# Patient Record
Sex: Female | Born: 1950
Health system: Southern US, Community
[De-identification: ages and names within clinical notes are randomized; demographics above are authoritative.]

## PROBLEM LIST (undated history)

## (undated) HISTORY — PX: APPENDECTOMY: SHX54

## (undated) HISTORY — PX: TUBAL LIGATION: SHX77

---

## 2000-08-02 ENCOUNTER — Other Ambulatory Visit: Admission: RE | Admit: 2000-08-02 | Discharge: 2000-08-02 | Payer: Self-pay | Admitting: Family Medicine

## 2015-07-23 DIAGNOSIS — E6609 Other obesity due to excess calories: Secondary | ICD-10-CM | POA: Insufficient documentation

## 2015-07-23 DIAGNOSIS — M150 Primary generalized (osteo)arthritis: Secondary | ICD-10-CM

## 2015-07-23 DIAGNOSIS — M159 Polyosteoarthritis, unspecified: Secondary | ICD-10-CM | POA: Insufficient documentation

## 2015-07-23 DIAGNOSIS — M8949 Other hypertrophic osteoarthropathy, multiple sites: Secondary | ICD-10-CM

## 2015-07-23 DIAGNOSIS — Z6830 Body mass index (BMI) 30.0-30.9, adult: Secondary | ICD-10-CM | POA: Insufficient documentation

## 2015-07-23 HISTORY — DX: Primary generalized (osteo)arthritis: M15.0

## 2015-07-23 HISTORY — DX: Polyosteoarthritis, unspecified: M15.9

## 2015-07-23 HISTORY — DX: Other hypertrophic osteoarthropathy, multiple sites: M89.49

## 2015-07-30 DIAGNOSIS — E782 Mixed hyperlipidemia: Secondary | ICD-10-CM

## 2015-07-30 DIAGNOSIS — M8580 Other specified disorders of bone density and structure, unspecified site: Secondary | ICD-10-CM | POA: Insufficient documentation

## 2015-07-30 HISTORY — DX: Mixed hyperlipidemia: E78.2

## 2017-09-21 DIAGNOSIS — F330 Major depressive disorder, recurrent, mild: Secondary | ICD-10-CM | POA: Insufficient documentation

## 2017-09-21 DIAGNOSIS — M519 Unspecified thoracic, thoracolumbar and lumbosacral intervertebral disc disorder: Secondary | ICD-10-CM

## 2017-09-21 DIAGNOSIS — Z780 Asymptomatic menopausal state: Secondary | ICD-10-CM | POA: Insufficient documentation

## 2017-09-21 HISTORY — DX: Major depressive disorder, recurrent, mild: F33.0

## 2017-09-21 HISTORY — DX: Unspecified thoracic, thoracolumbar and lumbosacral intervertebral disc disorder: M51.9

## 2018-02-02 HISTORY — PX: CHOLECYSTECTOMY: SHX55

## 2018-11-06 DIAGNOSIS — D75839 Thrombocytosis, unspecified: Secondary | ICD-10-CM | POA: Insufficient documentation

## 2018-11-17 LAB — HM COLONOSCOPY

## 2019-11-23 ENCOUNTER — Other Ambulatory Visit: Payer: Self-pay

## 2019-11-23 ENCOUNTER — Encounter: Payer: Self-pay | Admitting: Legal Medicine

## 2019-11-23 ENCOUNTER — Ambulatory Visit (INDEPENDENT_AMBULATORY_CARE_PROVIDER_SITE_OTHER): Payer: Medicare Other | Admitting: Legal Medicine

## 2019-11-23 VITALS — BP 118/80 | HR 91 | Temp 97.6°F | Resp 16 | Ht 61.0 in | Wt 166.8 lb

## 2019-11-23 DIAGNOSIS — Z23 Encounter for immunization: Secondary | ICD-10-CM

## 2019-11-23 DIAGNOSIS — E782 Mixed hyperlipidemia: Secondary | ICD-10-CM | POA: Diagnosis not present

## 2019-11-23 DIAGNOSIS — F17200 Nicotine dependence, unspecified, uncomplicated: Secondary | ICD-10-CM

## 2019-11-23 DIAGNOSIS — F17218 Nicotine dependence, cigarettes, with other nicotine-induced disorders: Secondary | ICD-10-CM | POA: Diagnosis not present

## 2019-11-23 DIAGNOSIS — I10 Essential (primary) hypertension: Secondary | ICD-10-CM | POA: Insufficient documentation

## 2019-11-23 DIAGNOSIS — F321 Major depressive disorder, single episode, moderate: Secondary | ICD-10-CM | POA: Diagnosis not present

## 2019-11-23 HISTORY — DX: Essential (primary) hypertension: I10

## 2019-11-23 HISTORY — DX: Nicotine dependence, unspecified, uncomplicated: F17.200

## 2019-11-23 LAB — PULMONARY FUNCTION TEST
FEV1/FVC: 65.2 %
FEV1: 1.46 L
FVC: 1.87 L

## 2019-11-23 MED ORDER — LOSARTAN POTASSIUM 50 MG PO TABS: 50.0000 mg | ORAL_TABLET | Freq: Every day | ORAL | 2 refills | Status: DC

## 2019-11-23 MED ORDER — ESCITALOPRAM OXALATE 10 MG PO TABS: 10.0000 mg | ORAL_TABLET | Freq: Every day | ORAL | 3 refills | Status: DC

## 2019-11-23 NOTE — Progress Notes (Signed)
New Patient Office Visit  Subjective:  Patient ID: Stephanie Mcdowell, female    DOB: 1950/08/24  Age: 69 y.o. MRN: [id number]  CC:  Chief Complaint  Patient presents with  . Establish Care    pt is not fasting    HPI Stephanie Mcdowell presents for lost husband 2 months ago. She is depressed  This patient has major for 2 months.  PHQ9 =6.  Patient is having less anhedonia.  The patient has improved future plans and prospects.  The depression is worse with stres.  The patient is not exercising and working on behavior to improve mental health.  Patient is not seeing a therapist or psychiatrist.  na  Patient is on exapro.  Patient presents for follow up of hypertension.  Patient tolerating losartan well with side effects.  Patient was diagnosed with hypertension 2010 so has been treated for hypertension for 10 years.Patient is working on maintaining diet and exercise regimen and follows up as directed. Complication include none.  History reviewed. No pertinent past medical history.  Past Surgical History:  Procedure Laterality Date  . APPENDECTOMY    . CHOLECYSTECTOMY  2020  . TUBAL LIGATION      Family History  Adopted: Yes    Social History   Socioeconomic History  . Marital status: Widowed    Spouse name: Not on file  . Number of children: 1  . Years of education: Not on file  . Highest education level: Not on file  Occupational History  . Not on file  Tobacco Use  . Smoking status: Current Some Day Smoker    Packs/day: 0.25    Years: 15.00    Pack years: 3.75    Types: Cigarettes  . Smokeless tobacco: Never Used  Vaping Use  . Vaping Use: Never used  Substance and Sexual Activity  . Alcohol use: Not Currently  . Drug use: Never  . Sexual activity: Not Currently  Other Topics Concern  . Not on file  Social History Narrative  . Not on file   Social Determinants of Health   Financial Resource Strain:   . Difficulty of Paying Living Expenses: Not on file  Food  Insecurity:   . Worried About Programme researcher, broadcasting/film/video in the Last Year: Not on file  . Ran Out of Food in the Last Year: Not on file  Transportation Needs:   . Lack of Transportation (Medical): Not on file  . Lack of Transportation (Non-Medical): Not on file  Physical Activity:   . Days of Exercise per Week: Not on file  . Minutes of Exercise per Session: Not on file  Stress:   . Feeling of Stress : Not on file  Social Connections:   . Frequency of Communication with Friends and Family: Not on file  . Frequency of Social Gatherings with Friends and Family: Not on file  . Attends Religious Services: Not on file  . Active Member of Clubs or Organizations: Not on file  . Attends Banker Meetings: Not on file  . Marital Status: Not on file  Intimate Partner Violence:   . Fear of Current or Ex-Partner: Not on file  . Emotionally Abused: Not on file  . Physically Abused: Not on file  . Sexually Abused: Not on file    ROS Review of Systems  Constitutional: Negative.   HENT: Negative.   Eyes: Negative.   Respiratory: Negative for cough, choking and shortness of breath.   Cardiovascular: Negative for chest pain  and leg swelling.  Gastrointestinal: Negative.   Endocrine: Negative.   Genitourinary: Negative.   Musculoskeletal: Negative.   Neurological: Negative.   Psychiatric/Behavioral: Negative.     Objective:   Today's Vitals: BP 118/80   Pulse 91   Temp 97.6 F (36.4 C)   Resp 16   Ht 5\' 1"  (1.549 m)   Wt 166 lb 12.8 oz (75.7 kg)   SpO2 98%   BMI 31.52 kg/m   Physical Exam Vitals reviewed.  Constitutional:      Appearance: Normal appearance. She is obese.  HENT:     Head: Normocephalic and atraumatic.     Right Ear: Tympanic membrane, ear canal and external ear normal.     Left Ear: Tympanic membrane, ear canal and external ear normal.     Nose: Nose normal.     Mouth/Throat:     Mouth: Mucous membranes are moist.  Eyes:     Extraocular Movements:  Extraocular movements intact.     Conjunctiva/sclera: Conjunctivae normal.     Pupils: Pupils are equal, round, and reactive to light.  Cardiovascular:     Rate and Rhythm: Normal rate and regular rhythm.     Pulses: Normal pulses.     Heart sounds: Normal heart sounds.  Pulmonary:     Breath sounds: Rhonchi present.  Abdominal:     General: Abdomen is flat. Bowel sounds are normal.     Palpations: Abdomen is soft.  Musculoskeletal:        General: Normal range of motion.  Skin:    General: Skin is warm and dry.     Capillary Refill: Capillary refill takes less than 2 seconds.  Neurological:     General: No focal deficit present.     Mental Status: She is alert and oriented to person, place, and time. Mental status is at baseline.  Psychiatric:     Comments: depressed   PFT: FVC 81%, FEV1 72%, FEV1/FVC 88, PEF 66% Depression screen [facility] 2/9 11/23/2019 11/23/2019  Decreased Interest 2 1  Down, Depressed, Hopeless 1 1  PHQ - 2 Score 3 2  Altered sleeping 1 1  Tired, decreased energy 1 0  Change in appetite 1 1  Feeling bad or failure about yourself  0 0  Trouble concentrating 0 0  Moving slowly or fidgety/restless 0 0  Suicidal thoughts 0 0  PHQ-9 Score 6 4  Difficult doing work/chores Not difficult at all -    Assessment & Plan:   Problem List Items Addressed This Visit      Cardiovascular and Mediastinum   Essential hypertension, benign   Relevant Medications   losartan (COZAAR) 50 MG tablet   Other Relevant Orders   CBC with Differential/Platelet   Comprehensive metabolic panel An individual hypertension care plan was established and reinforced today.  The patient's status was assessed using clinical findings on exam and labs or diagnostic tests. The patient's success at meeting treatment goals on disease specific evidence-based guidelines and found to be well controlled. SELF MANAGEMENT: The patient and I together assessed ways to personally work towards obtaining  the recommended goals. RECOMMENDATIONS: avoid decongestants found in common cold remedies, decrease consumption of alcohol, perform routine monitoring of BP with home BP cuff, exercise, reduction of dietary salt, take medicines as prescribed, try not to miss doses and quit smoking.  Regular exercise and maintaining a healthy weight is needed.  Stress reduction may help. A CLINICAL SUMMARY including written plan identify barriers to care unique to  individual due to social or financial issues.  We attempt to mutually creat solutions for individual and family understanding.     Other   Mixed hyperlipidemia   Relevant Medications   losartan (COZAAR) 50 MG tablet   Other Relevant Orders   Lipid panel AN INDIVIDUAL CARE PLAN for hyperlipidemia/ cholesterol was established and reinforced today.  The patient's status was assessed using clinical findings on exam, lab and other diagnostic tests. The patient's disease status was assessed based on evidence-based guidelines and found to be well controlled. MEDICATIONS were reviewed. SELF MANAGEMENT GOALS have been discussed and patient's success at attaining the goal of low cholesterol was assessed. RECOMMENDATION given include regular exercise 3 days a week and low cholesterol/low fat diet. CLINICAL SUMMARY including written plan to identify barriers unique to the patient due to social or economic  reasons was discussed.   Nicotine dependence Individual plan was given to patient based on exam, history and other tests and using evidence based criteria for care for smoking cessation.  We discussed behavioral changes to help cessation and offered counseling to aid in quitting.  Medical consequences of tobacco use were explained.    Other Visit Diagnoses    Depression, major, single episode, moderate (HCC)    -  Primary   Relevant Medications   escitalopram (LEXAPRO) 10 MG tablet Patient's depression is improving with lexapro.   Anhedonia better.  PHQ 9 was  performed score 6. An individual care plan was established or reinforced today.  The patient's disease status was assessed using clinical findings on exam, labs, and or other diagnostic testing to determine patient's success in meeting treatment goals based on disease specific evidence-based guidelines and found to be improving Recommendations include stay on medicine      Outpatient Encounter Medications as of 11/23/2019  Medication Sig  . escitalopram (LEXAPRO) 10 MG tablet Take 1 tablet (10 mg total) by mouth daily.  Marland Kitchen losartan (COZAAR) 50 MG tablet Take 1 tablet (50 mg total) by mouth daily.  . Multiple Vitamin (MULTIVITAMIN ADULT PO) Take by mouth.  . Multiple Vitamins-Minerals (AIRBORNE PO) Take by mouth.  . naproxen (NAPROSYN) 500 MG tablet Take 500 mg by mouth 2 (two) times daily as needed.  . Probiotic Product (PROBIOTIC DAILY PO) Take by mouth.  . [DISCONTINUED] escitalopram (LEXAPRO) 10 MG tablet Take by mouth.  . [DISCONTINUED] losartan (COZAAR) 50 MG tablet Take 50 mg by mouth daily.   No facility-administered encounter medications on file as of 11/23/2019.    Follow-up: Return in about 3 months (around 02/23/2020).   Brent Bulla, MD

## 2019-11-27 ENCOUNTER — Other Ambulatory Visit: Payer: Self-pay

## 2019-11-27 ENCOUNTER — Other Ambulatory Visit: Payer: Medicare Other

## 2019-11-28 ENCOUNTER — Other Ambulatory Visit: Payer: Self-pay | Admitting: Legal Medicine

## 2019-11-28 DIAGNOSIS — E782 Mixed hyperlipidemia: Secondary | ICD-10-CM

## 2019-11-28 LAB — COMPREHENSIVE METABOLIC PANEL
ALT: 11 IU/L (ref 0–32)
AST: 18 IU/L (ref 0–40)
Albumin/Globulin Ratio: 2 (ref 1.2–2.2)
Albumin: 4.4 g/dL (ref 3.8–4.8)
Alkaline Phosphatase: 94 IU/L (ref 44–121)
BUN/Creatinine Ratio: 6 — ABNORMAL LOW (ref 12–28)
BUN: 4 mg/dL — ABNORMAL LOW (ref 8–27)
Bilirubin Total: 0.2 mg/dL (ref 0.0–1.2)
CO2: 25 mmol/L (ref 20–29)
Calcium: 9.5 mg/dL (ref 8.7–10.3)
Chloride: 99 mmol/L (ref 96–106)
Creatinine, Ser: 0.7 mg/dL (ref 0.57–1.00)
GFR calc Af Amer: 102 mL/min/{1.73_m2} (ref >59)
GFR calc non Af Amer: 89 mL/min/{1.73_m2} (ref >59)
Globulin, Total: 2.2 g/dL (ref 1.5–4.5)
Glucose: 92 mg/dL (ref 65–99)
Potassium: 5 mmol/L (ref 3.5–5.2)
Sodium: 137 mmol/L (ref 134–144)
Total Protein: 6.6 g/dL (ref 6.0–8.5)

## 2019-11-28 LAB — LIPID PANEL
Chol/HDL Ratio: 3.6 ratio (ref 0.0–4.4)
Cholesterol, Total: 251 mg/dL — ABNORMAL HIGH (ref 100–199)
HDL: 70 mg/dL (ref >39)
LDL Chol Calc (NIH): 156 mg/dL — ABNORMAL HIGH (ref 0–99)
Triglycerides: 139 mg/dL (ref 0–149)
VLDL Cholesterol Cal: 25 mg/dL (ref 5–40)

## 2019-11-28 LAB — CBC WITH DIFFERENTIAL/PLATELET
Basophils Absolute: 0.1 10*3/uL (ref 0.0–0.2)
Basos: 1 %
EOS (ABSOLUTE): 0.1 10*3/uL (ref 0.0–0.4)
Eos: 2 %
Hematocrit: 43.9 % (ref 34.0–46.6)
Hemoglobin: 14.7 g/dL (ref 11.1–15.9)
Immature Grans (Abs): 0 10*3/uL (ref 0.0–0.1)
Immature Granulocytes: 0 %
Lymphocytes Absolute: 2.1 10*3/uL (ref 0.7–3.1)
Lymphs: 30 %
MCH: 32.3 pg (ref 26.6–33.0)
MCHC: 33.5 g/dL (ref 31.5–35.7)
MCV: 97 fL (ref 79–97)
Monocytes Absolute: 0.6 10*3/uL (ref 0.1–0.9)
Monocytes: 9 %
Neutrophils Absolute: 4 10*3/uL (ref 1.4–7.0)
Neutrophils: 58 %
Platelets: 402 10*3/uL (ref 150–450)
RBC: 4.55 x10E6/uL (ref 3.77–5.28)
RDW: 12.7 % (ref 11.7–15.4)
WBC: 6.9 10*3/uL (ref 3.4–10.8)

## 2019-11-28 LAB — CARDIOVASCULAR RISK ASSESSMENT

## 2019-11-28 MED ORDER — ATORVASTATIN CALCIUM 40 MG PO TABS: 40.0000 mg | ORAL_TABLET | Freq: Every day | ORAL | 3 refills | Status: DC

## 2019-11-28 NOTE — Addendum Note (Signed)
Addended by: Tawny Asal I on: 11/28/2019 02:30 PM   Modules accepted: Orders

## 2019-11-28 NOTE — Progress Notes (Unsigned)
atorvastatin

## 2019-11-28 NOTE — Progress Notes (Signed)
CBC norma, kidney and liver tests normal, LDL-c high 156 really need statin to lower cardiac risk lp

## 2020-01-09 DIAGNOSIS — H2513 Age-related nuclear cataract, bilateral: Secondary | ICD-10-CM | POA: Diagnosis not present

## 2020-01-09 DIAGNOSIS — H5203 Hypermetropia, bilateral: Secondary | ICD-10-CM | POA: Diagnosis not present

## 2020-02-15 DIAGNOSIS — M1612 Unilateral primary osteoarthritis, left hip: Secondary | ICD-10-CM | POA: Diagnosis not present

## 2020-02-29 ENCOUNTER — Ambulatory Visit: Payer: Medicare Other | Admitting: Legal Medicine

## 2020-03-05 ENCOUNTER — Other Ambulatory Visit: Payer: Self-pay

## 2020-03-05 ENCOUNTER — Ambulatory Visit (INDEPENDENT_AMBULATORY_CARE_PROVIDER_SITE_OTHER): Payer: Medicare Other | Admitting: Legal Medicine

## 2020-03-05 ENCOUNTER — Encounter: Payer: Self-pay | Admitting: Legal Medicine

## 2020-03-05 VITALS — BP 138/78 | HR 94 | Temp 96.5°F | Resp 16 | Ht 61.0 in | Wt 165.0 lb

## 2020-03-05 DIAGNOSIS — I10 Essential (primary) hypertension: Secondary | ICD-10-CM | POA: Diagnosis not present

## 2020-03-05 DIAGNOSIS — F17218 Nicotine dependence, cigarettes, with other nicotine-induced disorders: Secondary | ICD-10-CM | POA: Diagnosis not present

## 2020-03-05 DIAGNOSIS — M519 Unspecified thoracic, thoracolumbar and lumbosacral intervertebral disc disorder: Secondary | ICD-10-CM

## 2020-03-05 DIAGNOSIS — E782 Mixed hyperlipidemia: Secondary | ICD-10-CM

## 2020-03-05 DIAGNOSIS — F321 Major depressive disorder, single episode, moderate: Secondary | ICD-10-CM | POA: Diagnosis not present

## 2020-03-05 NOTE — Progress Notes (Signed)
Subjective:  Patient ID: Stephanie Mcdowell, female    DOB: [birth year]  Age: 70 y.o. MRN: [id number]  Chief Complaint  Patient presents with  . Hypertension  . Hyperlipidemia  . Depression    HPI: Chronic visit  Patient presents for follow up of hypertension.  Patient tolerating losartan well with side effects.  Patient was diagnosed with hypertension 2010 so has been treated for hypertension for 10 years.Patient is working on maintaining diet and exercise regimen and follows up as directed. Complication include none  Patient presents with hyperlipidemia.  Compliance with treatment has been good; patient takes medicines as directed, maintains low cholesterol diet, follows up as directed, and maintains exercise regimen.  Patient is using diet without problems..  This patient has situational depression for 6 months.  PHQ9 =0.  Patient is having less anhedonia.  The patient has improving future plans and prospects.  The depression is worse with stress.  The patient is not exercising and working on behavior to improve mental health.  Patient is not seeing a therapist or psychiatrist.  na  Patient is on none.   Current Outpatient Medications on File Prior to Visit  Medication Sig Dispense Refill  . losartan (COZAAR) 50 MG tablet Take 1 tablet (50 mg total) by mouth daily. 90 tablet 2  . Multiple Vitamins-Minerals (AIRBORNE PO) Take by mouth.    . naproxen (NAPROSYN) 500 MG tablet Take 500 mg by mouth 2 (two) times daily as needed.    . Probiotic Product (PROBIOTIC DAILY PO) Take by mouth.     No current facility-administered medications on file prior to visit.   Past Medical History:  Diagnosis Date  . Essential hypertension, benign 11/23/2019  . Lumbar disc disease 09/21/2017   Formatting of this note might be different from the original. Taking shots in back.  . Mild episode of recurrent major depressive disorder (HCC) 09/21/2017  . Mixed hyperlipidemia 07/30/2015  . Nicotine dependence  11/23/2019  . Primary osteoarthritis involving multiple joints 07/23/2015   Past Surgical History:  Procedure Laterality Date  . APPENDECTOMY    . CHOLECYSTECTOMY  2020  . TUBAL LIGATION      Family History  Adopted: Yes  Problem Relation Age of Onset  . Breast cancer Mother   . Stroke Father    Social History   Socioeconomic History  . Marital status: Widowed    Spouse name: Not on file  . Number of children: 1  . Years of education: Not on file  . Highest education level: Not on file  Occupational History  . Not on file  Tobacco Use  . Smoking status: Current Every Day Smoker    Packs/day: 0.50    Years: 15.00    Pack years: 7.50    Types: Cigarettes  . Smokeless tobacco: Never Used  Vaping Use  . Vaping Use: Never used  Substance and Sexual Activity  . Alcohol use: Not Currently  . Drug use: Never  . Sexual activity: Not Currently  Other Topics Concern  . Not on file  Social History Narrative  . Not on file   Social Determinants of Health   Financial Resource Strain: Not on file  Food Insecurity: Not on file  Transportation Needs: Not on file  Physical Activity: Not on file  Stress: Not on file  Social Connections: Not on file    Review of Systems  Constitutional: Negative for activity change and fatigue.  HENT: Negative for congestion and sinus pain.   Eyes: Negative  for visual disturbance.  Respiratory: Negative for chest tightness and shortness of breath.   Cardiovascular: Negative for chest pain, palpitations and leg swelling.  Gastrointestinal: Negative for abdominal distention and abdominal pain.  Endocrine: Negative for polyuria.  Genitourinary: Negative for difficulty urinating and dysuria.  Musculoskeletal: Positive for arthralgias and back pain.  Skin: Negative.   Neurological: Negative.   Psychiatric/Behavioral: Negative.      Objective:  BP 138/78   Pulse 94   Temp (!) 96.5 F (35.8 C)   Resp 16   Ht 5\' 1"  (1.549 m)   Wt 165 lb  (74.8 kg)   SpO2 91%   BMI 31.18 kg/m   BP/Weight 03/05/2020 11/23/2019  Systolic BP 138 118  Diastolic BP 78 80  Wt. (Lbs) 165 166.8  BMI 31.18 31.52    Physical Exam Vitals reviewed.  Constitutional:      General: She is not in acute distress.    Appearance: Normal appearance.  HENT:     Right Ear: Tympanic membrane normal.     Left Ear: Tympanic membrane normal.     Nose: Nose normal.     Mouth/Throat:     Mouth: Mucous membranes are moist.     Pharynx: Oropharynx is clear.  Eyes:     Extraocular Movements: Extraocular movements intact.     Conjunctiva/sclera: Conjunctivae normal.     Pupils: Pupils are equal, round, and reactive to light.  Cardiovascular:     Rate and Rhythm: Normal rate and regular rhythm.     Pulses: Normal pulses.     Heart sounds: No murmur heard. No gallop.   Pulmonary:     Effort: Pulmonary effort is normal. No respiratory distress.     Breath sounds: Normal breath sounds. No rales.  Abdominal:     General: Abdomen is flat. There is no distension.     Tenderness: There is no abdominal tenderness.  Musculoskeletal:        General: Tenderness (back, hip) present.     Cervical back: Normal range of motion and neck supple.  Skin:    General: Skin is warm.     Capillary Refill: Capillary refill takes less than 2 seconds.  Neurological:     General: No focal deficit present.     Mental Status: She is alert and oriented to person, place, and time. Mental status is at baseline.  Psychiatric:        Mood and Affect: Mood normal.        Thought Content: Thought content normal.        Judgment: Judgment normal.    Depression screen [facility] 2/9 03/05/2020 11/23/2019 11/23/2019  Decreased Interest 0 2 1  Down, Depressed, Hopeless 0 1 1  PHQ - 2 Score 0 3 2  Altered sleeping 0 1 1  Tired, decreased energy 0 1 0  Change in appetite 0 1 1  Feeling bad or failure about yourself  0 0 0  Trouble concentrating 0 0 0  Moving slowly or fidgety/restless 0 0  0  Suicidal thoughts 0 0 0  PHQ-9 Score 0 6 4  Difficult doing work/chores Not difficult at all Not difficult at all -       Lab Results  Component Value Date   WBC 6.9 11/27/2019   HGB 14.7 11/27/2019   HCT 43.9 11/27/2019   PLT 402 11/27/2019   GLUCOSE 92 11/27/2019   CHOL 251 (H) 11/27/2019   TRIG 139 11/27/2019   HDL 70 11/27/2019  LDLCALC 156 (H) 11/27/2019   ALT 11 11/27/2019   AST 18 11/27/2019   NA 137 11/27/2019   K 5.0 11/27/2019   CL 99 11/27/2019   CREATININE 0.70 11/27/2019   BUN 4 (L) 11/27/2019   CO2 25 11/27/2019      Assessment & Plan:   Diagnoses and all orders for this visit: Essential hypertension, benign -     CBC with Differential/Platelet -     Comprehensive metabolic panel An individual hypertension care plan was established and reinforced today.  The patient's status was assessed using clinical findings on exam and labs or diagnostic tests. The patient's success at meeting treatment goals on disease specific evidence-based guidelines and found to be well controlled. SELF MANAGEMENT: The patient and I together assessed ways to personally work towards obtaining the recommended goals. RECOMMENDATIONS: avoid decongestants found in common cold remedies, decrease consumption of alcohol, perform routine monitoring of BP with home BP cuff, exercise, reduction of dietary salt, take medicines as prescribed, try not to miss doses and quit smoking.  Regular exercise and maintaining a healthy weight is needed.  Stress reduction may help. A CLINICAL SUMMARY including written plan identify barriers to care unique to individual due to social or financial issues.  We attempt to mutually creat solutions for individual and family understanding.  Depression, major, single episode, moderate (HCC) Patient's depression is controlled with lexapro.   Anhedonia better.  PHQ 9 was performed score 0. An individual care plan was established or reinforced today.  The patient's  disease status was assessed using clinical findings on exam, labs, and or other diagnostic testing to determine patient's success in meeting treatment goals based on disease specific evidence-based guidelines and found to be improving Recommendations include can stop lexapro  Lumbar disc disease Patient has chronic low back pain  Mixed hyperlipidemia -     Lipid panel AN INDIVIDUAL CARE PLAN for hyperlipidemia/ cholesterol was established and reinforced today.  The patient's status was assessed using clinical findings on exam, lab and other diagnostic tests. The patient's disease status was assessed based on evidence-based guidelines and found to be well controlled. MEDICATIONS were reviewed. SELF MANAGEMENT GOALS have been discussed and patient's success at attaining the goal of low cholesterol was assessed. RECOMMENDATION given include regular exercise 3 days a week and low cholesterol/low fat diet. CLINICAL SUMMARY including written plan to identify barriers unique to the patient due to social or economic  reasons was discussed.  Cigarette nicotine dependence with other nicotine-induced disorder Individual plan was given to patient based on exam, history and other tests and using evidence based criteria for care for smoking cessation.  We discussed behavioral changes to help cessation and offered counseling to aid in quitting.  Medical consequences of tobacco use were explained.          I spent 25 minutes dedicated to the care of this patient on the date of this encounter to include face-to-face time with the patient, as well as:   Follow-up: Return in about 6 months (around 09/02/2020) for fasting.  An After Visit Summary was printed and given to the patient.  Brent Bulla, MD [facility] [phone]

## 2020-03-06 LAB — CBC WITH DIFFERENTIAL/PLATELET
Basophils Absolute: 0.1 10*3/uL (ref 0.0–0.2)
Basos: 2 %
EOS (ABSOLUTE): 0.1 10*3/uL (ref 0.0–0.4)
Eos: 1 %
Hematocrit: 41.6 % (ref 34.0–46.6)
Hemoglobin: 14.3 g/dL (ref 11.1–15.9)
Immature Grans (Abs): 0 10*3/uL (ref 0.0–0.1)
Immature Granulocytes: 0 %
Lymphocytes Absolute: 2.7 10*3/uL (ref 0.7–3.1)
Lymphs: 41 %
MCH: 32.9 pg (ref 26.6–33.0)
MCHC: 34.4 g/dL (ref 31.5–35.7)
MCV: 96 fL (ref 79–97)
Monocytes Absolute: 0.7 10*3/uL (ref 0.1–0.9)
Monocytes: 10 %
Neutrophils Absolute: 3.1 10*3/uL (ref 1.4–7.0)
Neutrophils: 46 %
Platelets: 381 10*3/uL (ref 150–450)
RBC: 4.34 x10E6/uL (ref 3.77–5.28)
RDW: 12.2 % (ref 11.7–15.4)
WBC: 6.7 10*3/uL (ref 3.4–10.8)

## 2020-03-06 LAB — COMPREHENSIVE METABOLIC PANEL
ALT: 8 IU/L (ref 0–32)
AST: 12 IU/L (ref 0–40)
Albumin/Globulin Ratio: 1.8 (ref 1.2–2.2)
Albumin: 4.1 g/dL (ref 3.8–4.8)
Alkaline Phosphatase: 92 IU/L (ref 44–121)
BUN/Creatinine Ratio: 12 (ref 12–28)
BUN: 10 mg/dL (ref 8–27)
Bilirubin Total: 0.3 mg/dL (ref 0.0–1.2)
CO2: 24 mmol/L (ref 20–29)
Calcium: 9.2 mg/dL (ref 8.7–10.3)
Chloride: 101 mmol/L (ref 96–106)
Creatinine, Ser: 0.81 mg/dL (ref 0.57–1.00)
GFR calc Af Amer: 85 mL/min/{1.73_m2} (ref >59)
GFR calc non Af Amer: 74 mL/min/{1.73_m2} (ref >59)
Globulin, Total: 2.3 g/dL (ref 1.5–4.5)
Glucose: 107 mg/dL — ABNORMAL HIGH (ref 65–99)
Potassium: 4.5 mmol/L (ref 3.5–5.2)
Sodium: 140 mmol/L (ref 134–144)
Total Protein: 6.4 g/dL (ref 6.0–8.5)

## 2020-03-06 LAB — LIPID PANEL
Chol/HDL Ratio: 3.8 ratio (ref 0.0–4.4)
Cholesterol, Total: 233 mg/dL — ABNORMAL HIGH (ref 100–199)
HDL: 61 mg/dL (ref >39)
LDL Chol Calc (NIH): 141 mg/dL — ABNORMAL HIGH (ref 0–99)
Triglycerides: 173 mg/dL — ABNORMAL HIGH (ref 0–149)
VLDL Cholesterol Cal: 31 mg/dL (ref 5–40)

## 2020-03-06 LAB — CARDIOVASCULAR RISK ASSESSMENT

## 2020-03-06 NOTE — Progress Notes (Signed)
CBC normal, glucose 107, kidney tests normal, liver tests normal, triglycerides are high 173, LDL ( bad cholesterol) is high I strongly recommend statin to lower cardiovascular risk,  lp

## 2020-08-13 DIAGNOSIS — M1612 Unilateral primary osteoarthritis, left hip: Secondary | ICD-10-CM | POA: Diagnosis not present

## 2020-08-22 ENCOUNTER — Other Ambulatory Visit: Payer: Self-pay | Admitting: Legal Medicine

## 2020-08-22 DIAGNOSIS — I10 Essential (primary) hypertension: Secondary | ICD-10-CM

## 2020-08-22 DIAGNOSIS — E559 Vitamin D deficiency, unspecified: Secondary | ICD-10-CM | POA: Diagnosis not present

## 2020-08-22 DIAGNOSIS — M79609 Pain in unspecified limb: Secondary | ICD-10-CM | POA: Diagnosis not present

## 2020-08-22 DIAGNOSIS — Z01818 Encounter for other preprocedural examination: Secondary | ICD-10-CM | POA: Diagnosis not present

## 2020-08-22 DIAGNOSIS — R52 Pain, unspecified: Secondary | ICD-10-CM | POA: Diagnosis not present

## 2020-08-22 DIAGNOSIS — Z79899 Other long term (current) drug therapy: Secondary | ICD-10-CM | POA: Diagnosis not present

## 2020-09-04 ENCOUNTER — Other Ambulatory Visit: Payer: Self-pay

## 2020-09-04 ENCOUNTER — Encounter: Payer: Self-pay | Admitting: Legal Medicine

## 2020-09-04 ENCOUNTER — Ambulatory Visit (INDEPENDENT_AMBULATORY_CARE_PROVIDER_SITE_OTHER): Payer: Medicare Other | Admitting: Legal Medicine

## 2020-09-04 VITALS — BP 130/80 | HR 100 | Temp 97.3°F | Resp 16 | Ht 61.0 in | Wt 160.0 lb

## 2020-09-04 DIAGNOSIS — M519 Unspecified thoracic, thoracolumbar and lumbosacral intervertebral disc disorder: Secondary | ICD-10-CM

## 2020-09-04 DIAGNOSIS — F17218 Nicotine dependence, cigarettes, with other nicotine-induced disorders: Secondary | ICD-10-CM

## 2020-09-04 DIAGNOSIS — F321 Major depressive disorder, single episode, moderate: Secondary | ICD-10-CM

## 2020-09-04 DIAGNOSIS — E782 Mixed hyperlipidemia: Secondary | ICD-10-CM | POA: Diagnosis not present

## 2020-09-04 DIAGNOSIS — I10 Essential (primary) hypertension: Secondary | ICD-10-CM

## 2020-09-04 DIAGNOSIS — Z01818 Encounter for other preprocedural examination: Secondary | ICD-10-CM | POA: Diagnosis not present

## 2020-09-04 NOTE — Progress Notes (Signed)
Established Patient Office Visit  Subjective:  Patient ID: Stephanie Mcdowell, female    DOB: [birth year]  Age: 70 y.o. MRN: [id number]  CC:  Chief Complaint  Patient presents with   surgery Clearance   Hypertension   Hyperlipidemia    HPI Stephanie Mcdowell presents for chronic visit.  Plans for THA left by Dr. Evelene Croon.  No chf, MI, CAD, renal disease, no strokes, no recent surgery, no renal failure, no bleeding diathesis.  Patient presents for follow up of hypertension.  Patient tolerating losartan well with side effects.  Patient was diagnosed with hypertension 2010 so has been treated for hypertension for 10 years.Patient is working on maintaining diet and exercise regimen and follows up as directed. Complication include none  History of arthritis and ip oa..   Past Medical History:  Diagnosis Date   Essential hypertension, benign 11/23/2019   Lumbar disc disease 09/21/2017   Formatting of this note might be different from the original. Taking shots in back.   Mild episode of recurrent major depressive disorder (HCC) 09/21/2017   Mixed hyperlipidemia 07/30/2015   Nicotine dependence 11/23/2019   Primary osteoarthritis involving multiple joints 07/23/2015    Past Surgical History:  Procedure Laterality Date   APPENDECTOMY     CHOLECYSTECTOMY  2020   TUBAL LIGATION      Family History  Adopted: Yes  Problem Relation Age of Onset   Breast cancer Mother    Stroke Father     Social History   Socioeconomic History   Marital status: Widowed    Spouse name: Not on file   Number of children: 1   Years of education: Not on file   Highest education level: Not on file  Occupational History   Not on file  Tobacco Use   Smoking status: Every Day    Packs/day: 0.50    Years: 15.00    Pack years: 7.50    Types: Cigarettes   Smokeless tobacco: Never  Vaping Use   Vaping Use: Never used  Substance and Sexual Activity   Alcohol use: Not Currently   Drug use: Never   Sexual  activity: Not Currently  Other Topics Concern   Not on file  Social History Narrative   Not on file   Social Determinants of Health   Financial Resource Strain: Not on file  Food Insecurity: Not on file  Transportation Needs: Not on file  Physical Activity: Not on file  Stress: Not on file  Social Connections: Not on file  Intimate Partner Violence: Not on file    Outpatient Medications Prior to Visit  Medication Sig Dispense Refill   losartan (COZAAR) 50 MG tablet TAKE 1 TABLET(50 MG) BY MOUTH DAILY 90 tablet 2   Multiple Vitamins-Minerals (AIRBORNE PO) Take by mouth.     naproxen (NAPROSYN) 500 MG tablet Take 500 mg by mouth 2 (two) times daily as needed.     Probiotic Product (PROBIOTIC DAILY PO) Take by mouth.     No facility-administered medications prior to visit.    No Known Allergies  ROS Review of Systems  Constitutional:  Negative for activity change and appetite change.  HENT:  Negative for congestion.   Eyes:  Negative for visual disturbance.  Respiratory:  Negative for chest tightness and shortness of breath.   Cardiovascular:  Negative for chest pain and palpitations.  Gastrointestinal:  Negative for abdominal distention and abdominal pain.  Endocrine: Negative for polyuria.  Genitourinary:  Negative for difficulty urinating and dysuria.  Musculoskeletal:  Negative for arthralgias and back pain.  Neurological: Negative.   Psychiatric/Behavioral: Negative.       Objective:    Physical Exam Vitals reviewed.  Constitutional:      Appearance: Normal appearance.  HENT:     Head: Normocephalic and atraumatic.     Right Ear: Tympanic membrane, ear canal and external ear normal.     Left Ear: Tympanic membrane, ear canal and external ear normal.     Nose: Nose normal.     Mouth/Throat:     Mouth: Mucous membranes are moist.     Pharynx: Oropharynx is clear.  Eyes:     Extraocular Movements: Extraocular movements intact.     Conjunctiva/sclera:  Conjunctivae normal.     Pupils: Pupils are equal, round, and reactive to light.  Cardiovascular:     Rate and Rhythm: Normal rate and regular rhythm.     Pulses: Normal pulses.     Heart sounds: Normal heart sounds. No murmur heard.   No gallop.  Pulmonary:     Effort: Pulmonary effort is normal. No respiratory distress.     Breath sounds: Normal breath sounds. No wheezing.  Abdominal:     General: Abdomen is flat. Bowel sounds are normal. There is no distension.     Palpations: Abdomen is soft.     Tenderness: There is no abdominal tenderness.  Musculoskeletal:        General: Tenderness present.     Cervical back: Normal range of motion.     Right lower leg: No edema.     Left lower leg: No edema.     Comments: Oa lumbar spine and both hips  Skin:    General: Skin is warm.     Capillary Refill: Capillary refill takes less than 2 seconds.  Neurological:     General: No focal deficit present.     Mental Status: She is alert and oriented to person, place, and time. Mental status is at baseline.  Psychiatric:        Mood and Affect: Mood normal.   Depression screen [facility] 2/9 09/04/2020 03/05/2020 11/23/2019 11/23/2019  Decreased Interest 0 0 2 1  Down, Depressed, Hopeless 0 0 1 1  PHQ - 2 Score 0 0 3 2  Altered sleeping 0 0 1 1  Tired, decreased energy 1 0 1 0  Change in appetite 1 0 1 1  Feeling bad or failure about yourself  0 0 0 0  Trouble concentrating 0 0 0 0  Moving slowly or fidgety/restless 0 0 0 0  Suicidal thoughts 0 0 0 0  PHQ-9 Score 2 0 6 4  Difficult doing work/chores Not difficult at all Not difficult at all Not difficult at all -    BP 130/80   Pulse 100   Temp (!) 97.3 F (36.3 C)   Resp 16   Ht 5\' 1"  (1.549 m)   Wt 160 lb (72.6 kg)   LMP  (LMP Unknown)   SpO2 97%   BMI 30.23 kg/m  Wt Readings from Last 3 Encounters:  09/04/20 160 lb (72.6 kg)  03/05/20 165 lb (74.8 kg)  11/23/19 166 lb 12.8 oz (75.7 kg)     Health Maintenance Due  Topic Date  Due   Hepatitis C Screening  Never done   COLONOSCOPY (Pts [id number] Insurance coverage will need to be confirmed)  Never done   MAMMOGRAM  Never done   Zoster Vaccines- Shingrix (1 of 2) Never done   TETANUS/TDAP  02/02/2014   DEXA SCAN  Never done   INFLUENZA VACCINE  09/02/2020    There are no preventive care reminders to display for this patient.  No results found for: TSH Lab Results  Component Value Date   WBC 6.7 03/05/2020   HGB 14.3 03/05/2020   HCT 41.6 03/05/2020   MCV 96 03/05/2020   PLT 381 03/05/2020   Lab Results  Component Value Date   NA 140 03/05/2020   K 4.5 03/05/2020   CO2 24 03/05/2020   GLUCOSE 107 (H) 03/05/2020   BUN 10 03/05/2020   CREATININE 0.81 03/05/2020   BILITOT 0.3 03/05/2020   ALKPHOS 92 03/05/2020   AST 12 03/05/2020   ALT 8 03/05/2020   PROT 6.4 03/05/2020   ALBUMIN 4.1 03/05/2020   CALCIUM 9.2 03/05/2020   Lab Results  Component Value Date   CHOL 233 (H) 03/05/2020   Lab Results  Component Value Date   HDL 61 03/05/2020   Lab Results  Component Value Date   LDLCALC 141 (H) 03/05/2020   Lab Results  Component Value Date   TRIG 173 (H) 03/05/2020   Lab Results  Component Value Date   CHOLHDL 3.8 03/05/2020   No results found for: HGBA1C    Assessment & Plan:   Problem List Items Addressed This Visit       Cardiovascular and Mediastinum   Hypertension An individual hypertension care plan was established and reinforced today.  The patient's status was assessed using clinical findings on exam and labs or diagnostic tests. The patient's success at meeting treatment goals on disease specific evidence-based guidelines and found to be well controlled. SELF MANAGEMENT: The patient and I together assessed ways to personally work towards obtaining the recommended goals. RECOMMENDATIONS: avoid decongestants found in common cold remedies, decrease consumption of alcohol, perform routine monitoring of BP with home BP cuff,  exercise, reduction of dietary salt, take medicines as prescribed, try not to miss doses and quit smoking.  Regular exercise and maintaining a healthy weight is needed.  Stress reduction may help. A CLINICAL SUMMARY including written plan identify barriers to care unique to individual due to social or financial issues.  We attempt to mutually creat solutions for individual and family understanding.      Musculoskeletal and Integument   Lumbar disc disease Patient has chronic back disease but getting along well     Other   Mixed hyperlipidemia AN INDIVIDUAL CARE PLAN for hyperlipidemia/ cholesterol was established and reinforced today.  The patient's status was assessed using clinical findings on exam, lab and other diagnostic tests. The patient's disease status was assessed based on evidence-based guidelines and found to be fair controlled. MEDICATIONS were reviewed. SELF MANAGEMENT GOALS have been discussed and patient's success at attaining the goal of low cholesterol was assessed. RECOMMENDATION given include regular exercise 3 days a week and low cholesterol/low fat diet. CLINICAL SUMMARY including written plan to identify barriers unique to the patient due to social or economic  reasons was discussed.     Nicotine dependence Individual plan was given to patient based on exam, history and other tests and using evidence based criteria for care for smoking cessation.  We discussed behavioral changes to help cessation and offered medicines and counseling to aid in quitting.  Medical consequences of tobacco use were explained.    Other Visit Diagnoses     Pre-operative clearance    -  Primary Patient is cleared for surgery    Depression, major, single  episode, moderate (HCC)   Patient's depression is controlled with n medicines.   Anhedonia better.  PHQ 9 was performed score 2. An individual care plan was established or reinforced today.  The patient's disease status was assessed using  clinical findings on exam, labs, and or other diagnostic testing to determine patient's success in meeting treatment goals based on disease specific evidence-based guidelines and found to be improving Recommendations include no change in medicines             Follow-up: Return in about 6 months (around 03/07/2021) for fasting.    Brent Bulla, MD

## 2020-09-10 DIAGNOSIS — M1612 Unilateral primary osteoarthritis, left hip: Secondary | ICD-10-CM | POA: Diagnosis not present

## 2020-09-16 DIAGNOSIS — Z01818 Encounter for other preprocedural examination: Secondary | ICD-10-CM | POA: Diagnosis not present

## 2020-09-16 DIAGNOSIS — F17210 Nicotine dependence, cigarettes, uncomplicated: Secondary | ICD-10-CM | POA: Diagnosis not present

## 2020-09-16 DIAGNOSIS — J449 Chronic obstructive pulmonary disease, unspecified: Secondary | ICD-10-CM | POA: Diagnosis not present

## 2020-09-16 DIAGNOSIS — Z471 Aftercare following joint replacement surgery: Secondary | ICD-10-CM | POA: Diagnosis not present

## 2020-09-16 DIAGNOSIS — M1612 Unilateral primary osteoarthritis, left hip: Secondary | ICD-10-CM | POA: Diagnosis not present

## 2020-09-16 DIAGNOSIS — Z79899 Other long term (current) drug therapy: Secondary | ICD-10-CM | POA: Diagnosis not present

## 2020-09-16 DIAGNOSIS — R600 Localized edema: Secondary | ICD-10-CM | POA: Diagnosis not present

## 2020-09-16 DIAGNOSIS — Z9889 Other specified postprocedural states: Secondary | ICD-10-CM | POA: Diagnosis not present

## 2020-09-16 DIAGNOSIS — I10 Essential (primary) hypertension: Secondary | ICD-10-CM | POA: Diagnosis not present

## 2020-09-16 HISTORY — PX: TOTAL HIP ARTHROPLASTY: SHX124

## 2020-09-17 DIAGNOSIS — J449 Chronic obstructive pulmonary disease, unspecified: Secondary | ICD-10-CM | POA: Diagnosis not present

## 2020-09-17 DIAGNOSIS — Z79899 Other long term (current) drug therapy: Secondary | ICD-10-CM | POA: Diagnosis not present

## 2020-09-17 DIAGNOSIS — M1612 Unilateral primary osteoarthritis, left hip: Secondary | ICD-10-CM | POA: Diagnosis not present

## 2020-09-17 DIAGNOSIS — I10 Essential (primary) hypertension: Secondary | ICD-10-CM | POA: Diagnosis not present

## 2020-09-17 DIAGNOSIS — F17210 Nicotine dependence, cigarettes, uncomplicated: Secondary | ICD-10-CM | POA: Diagnosis not present

## 2020-09-24 DIAGNOSIS — Z96642 Presence of left artificial hip joint: Secondary | ICD-10-CM | POA: Diagnosis not present

## 2020-09-24 DIAGNOSIS — M1612 Unilateral primary osteoarthritis, left hip: Secondary | ICD-10-CM | POA: Diagnosis not present

## 2020-11-12 DIAGNOSIS — M1612 Unilateral primary osteoarthritis, left hip: Secondary | ICD-10-CM | POA: Diagnosis not present

## 2021-03-12 ENCOUNTER — Ambulatory Visit (INDEPENDENT_AMBULATORY_CARE_PROVIDER_SITE_OTHER): Payer: Medicare Other | Admitting: Legal Medicine

## 2021-03-12 ENCOUNTER — Encounter: Payer: Self-pay | Admitting: Legal Medicine

## 2021-03-12 ENCOUNTER — Other Ambulatory Visit: Payer: Self-pay

## 2021-03-12 ENCOUNTER — Telehealth: Payer: Self-pay | Admitting: Legal Medicine

## 2021-03-12 VITALS — BP 130/70 | HR 92 | Temp 97.9°F | Resp 15 | Ht 61.0 in | Wt 151.0 lb

## 2021-03-12 DIAGNOSIS — Z1231 Encounter for screening mammogram for malignant neoplasm of breast: Secondary | ICD-10-CM

## 2021-03-12 DIAGNOSIS — E782 Mixed hyperlipidemia: Secondary | ICD-10-CM

## 2021-03-12 DIAGNOSIS — F321 Major depressive disorder, single episode, moderate: Secondary | ICD-10-CM | POA: Diagnosis not present

## 2021-03-12 DIAGNOSIS — N951 Menopausal and female climacteric states: Secondary | ICD-10-CM

## 2021-03-12 DIAGNOSIS — M8580 Other specified disorders of bone density and structure, unspecified site: Secondary | ICD-10-CM

## 2021-03-12 DIAGNOSIS — F17218 Nicotine dependence, cigarettes, with other nicotine-induced disorders: Secondary | ICD-10-CM | POA: Diagnosis not present

## 2021-03-12 DIAGNOSIS — I10 Essential (primary) hypertension: Secondary | ICD-10-CM | POA: Diagnosis not present

## 2021-03-12 MED ORDER — NAPROXEN 500 MG PO TABS: 500.0000 mg | ORAL_TABLET | Freq: Two times a day (BID) | ORAL | 2 refills | Status: DC | PRN

## 2021-03-12 NOTE — Progress Notes (Signed)
Subjective:  Patient ID: Stephanie Mcdowell, female    DOB: 28-Jan-1951  Age: 71 y.o. MRN: [id number]  Chief Complaint  Patient presents with   Hypertension   Hyperlipidemia   Pain    HPI Hypertension: Patient is taking Losartan 50 mg daily. She takes blood pressure every day and the average is 130/70.   Chronic pain: Naproxen 500 mg twice daily as needed.   Current Outpatient Medications on File Prior to Visit  Medication Sig Dispense Refill   losartan (COZAAR) 50 MG tablet TAKE 1 TABLET(50 MG) BY MOUTH DAILY 90 tablet 2   Multiple Vitamins-Minerals (AIRBORNE PO) Take by mouth.     Probiotic Product (PROBIOTIC DAILY PO) Take by mouth.     No current facility-administered medications on file prior to visit.   Past Medical History:  Diagnosis Date   Essential hypertension, benign 11/23/2019   Lumbar disc disease 09/21/2017   Formatting of this note might be different from the original. Taking shots in back.   Mild episode of recurrent major depressive disorder ([place]) 09/21/2017   Mixed hyperlipidemia 07/30/2015   Nicotine dependence 11/23/2019   Primary osteoarthritis involving multiple joints 07/23/2015   Past Surgical History:  Procedure Laterality Date   APPENDECTOMY     CHOLECYSTECTOMY  2020   TOTAL HIP ARTHROPLASTY Left 09/16/2020   TUBAL LIGATION      Family History  Adopted: Yes  Problem Relation Age of Onset   Breast cancer Mother    Stroke Father    Social History   Socioeconomic History   Marital status: Widowed    Spouse name: Not on file   Number of children: 1   Years of education: Not on file   Highest education level: Not on file  Occupational History   Not on file  Tobacco Use   Smoking status: Every Day    Packs/day: 1.00    Years: 15.00    Pack years: 15.00    Types: Cigarettes   Smokeless tobacco: Never  Vaping Use   Vaping Use: Never used  Substance and Sexual Activity   Alcohol use: Not Currently   Drug use: Never   Sexual activity: Not  Currently  Other Topics Concern   Not on file  Social History Narrative   Not on file   Social Determinants of Health   Financial Resource Strain: Not on file  Food Insecurity: Not on file  Transportation Needs: Not on file  Physical Activity: Not on file  Stress: Not on file  Social Connections: Not on file    Review of Systems  Constitutional:  Negative for chills, fatigue and fever.  HENT:  Negative for congestion, ear pain and sore throat.   Respiratory:  Negative for cough and shortness of breath.   Cardiovascular:  Negative for chest pain and palpitations.  Gastrointestinal:  Negative for abdominal pain, constipation, diarrhea, nausea and vomiting.  Endocrine: Negative for polydipsia, polyphagia and polyuria.  Genitourinary:  Negative for difficulty urinating and dysuria.  Musculoskeletal:  Negative for arthralgias, back pain and myalgias.  Skin:  Negative for rash.  Neurological:  Negative for headaches.  Psychiatric/Behavioral:  Negative for dysphoric mood. The patient is not nervous/anxious.     Objective:  BP 130/70    Pulse 92    Temp 97.9 F (36.6 C)    Resp 15    Ht 5\' 1"  (1.549 m)    Wt 151 lb (68.5 kg)    LMP  (LMP Unknown)    SpO2 98%  BMI 28.53 kg/m   BP/Weight 03/12/2021 09/04/2020 03/05/2020  Systolic BP 130 130 138  Diastolic BP 70 80 78  Wt. (Lbs) 151 160 165  BMI 28.53 30.23 31.18    Physical Exam Constitutional:      Appearance: Normal appearance. She is obese.  HENT:     Head: Normocephalic.     Right Ear: Tympanic membrane, ear canal and external ear normal.     Left Ear: Tympanic membrane, ear canal and external ear normal.     Nose: Nose normal.     Mouth/Throat:     Mouth: Mucous membranes are moist.     Pharynx: Oropharynx is clear. No posterior oropharyngeal erythema.  Eyes:     Extraocular Movements: Extraocular movements intact.     Conjunctiva/sclera: Conjunctivae normal.     Pupils: Pupils are equal, round, and reactive to light.   Neck:     Vascular: No carotid bruit.  Cardiovascular:     Rate and Rhythm: Normal rate and regular rhythm.     Pulses: Normal pulses.     Heart sounds: Normal heart sounds. No murmur heard. Pulmonary:     Effort: Pulmonary effort is normal.     Breath sounds: Normal breath sounds.  Abdominal:     General: Bowel sounds are normal.     Palpations: Abdomen is soft. There is no mass.  Musculoskeletal:        General: Normal range of motion.     Cervical back: Normal range of motion. No tenderness.     Right lower leg: No edema.     Left lower leg: No edema.  Neurological:     Gait: Gait normal.     Deep Tendon Reflexes: Reflexes normal.  Psychiatric:        Mood and Affect: Mood normal.        Behavior: Behavior normal.        Thought Content: Thought content normal.    Diabetic Foot Exam - Simple   No data filed      Lab Results  Component Value Date   WBC 6.7 03/05/2020   HGB 14.3 03/05/2020   HCT 41.6 03/05/2020   PLT 381 03/05/2020   GLUCOSE 107 (H) 03/05/2020   CHOL 233 (H) 03/05/2020   TRIG 173 (H) 03/05/2020   HDL 61 03/05/2020   LDLCALC 141 (H) 03/05/2020   ALT 8 03/05/2020   AST 12 03/05/2020   NA 140 03/05/2020   K 4.5 03/05/2020   CL 101 03/05/2020   CREATININE 0.81 03/05/2020   BUN 10 03/05/2020   CO2 24 03/05/2020      Assessment & Plan:   Problem List Items Addressed This Visit       Cardiovascular and Mediastinum   Essential hypertension, benign - Primary   Relevant Orders   Comprehensive metabolic panel   CBC with Differential/Platelet     Musculoskeletal and Integument   Osteopenia     Other   Mixed hyperlipidemia   Relevant Orders   Lipid panel   Nicotine dependence   Other Visit Diagnoses     Depression, major, single episode, moderate (HCC)         .  Meds ordered this encounter  Medications   naproxen (NAPROSYN) 500 MG tablet    Sig: Take 1 tablet (500 mg total) by mouth 2 (two) times daily as needed.     Dispense:  60 tablet    Refill:  2    Orders Placed This Encounter  Procedures   Comprehensive metabolic panel   Lipid panel   CBC with Differential/Platelet    I,Darby Shadwick I Leal-Borjas,acting as a scribe for Reinaldo Meeker, MD.,have documented all relevant documentation on the behalf of Reinaldo Meeker, MD,as directed by  Reinaldo Meeker, MD while in the presence of Reinaldo Meeker, MD.   Follow-up: No follow-ups on file.  An After Visit Summary was printed and given to the patient.  Reinaldo Meeker, MD [facility] [phone]

## 2021-03-12 NOTE — Telephone Encounter (Signed)
° °  Stephanie Mcdowell has been scheduled for the following appointment:  WHAT: Bone Density WHERE: [facility] DATE: 03/18/21 TIME: 12:30 PM arrival time  Patient has been made aware.

## 2021-03-12 NOTE — Progress Notes (Signed)
Subjective:  Patient ID: Stephanie Mcdowell, female    DOB: 05-09-1950  Age: 71 y.o. MRN: [id number]  Chief Complaint  Patient presents with   Hypertension   Hyperlipidemia   Pain    HPI Well Adult Physical: Patient here for a comprehensive physical exam.The patient reports problems - hypertension Do you take any herbs or supplements that were not prescribed by a doctor? no Are you taking calcium supplements? no Are you taking aspirin daily? no  Encounter for general adult medical examination without abnormal findings  Physical ("At Risk" items are starred): Patient's last physical exam was 1 year ago .  Patient is not afflicted from Stress Incontinence and Urge Incontinence  Patient wears a seat belt, has smoke detectors, has carbon monoxide detectors, practices appropriate gun safety, and wears sunscreen with extended sun exposure. Dental Care: biannual cleanings, brushes and flosses daily. Ophthalmology/Optometry: Annual visit.  Hearing loss: none Vision impairments: no [employer] Visit from 03/12/2021 in [place]  PHQ-2 Total Score 2               Social Hx   Social History   Socioeconomic History   Marital status: Widowed    Spouse name: Not on file   Number of children: 1   Years of education: Not on file   Highest education level: Not on file  Occupational History   Not on file  Tobacco Use   Smoking status: Every Day    Packs/day: 1.00    Years: 15.00    Pack years: 15.00    Types: Cigarettes   Smokeless tobacco: Never  Vaping Use   Vaping Use: Never used  Substance and Sexual Activity   Alcohol use: Not Currently   Drug use: Never   Sexual activity: Not Currently  Other Topics Concern   Not on file  Social History Narrative   Not on file   Social Determinants of Health   Financial Resource Strain: Not on file  Food Insecurity: Not on file  Transportation Needs: Not on file  Physical Activity: Not on file  Stress: Not on file   Social Connections: Not on file   Past Medical History:  Diagnosis Date   Essential hypertension, benign 11/23/2019   Lumbar disc disease 09/21/2017   Formatting of this note might be different from the original. Taking shots in back.   Mild episode of recurrent major depressive disorder ([place]) 09/21/2017   Mixed hyperlipidemia 07/30/2015   Nicotine dependence 11/23/2019   Primary osteoarthritis involving multiple joints 07/23/2015   Past Surgical History:  Procedure Laterality Date   APPENDECTOMY     CHOLECYSTECTOMY  2020   TOTAL HIP ARTHROPLASTY Left 09/16/2020   TUBAL LIGATION      Family History  Adopted: Yes  Problem Relation Age of Onset   Breast cancer Mother    Stroke Father     Review of Systems  Constitutional:  Negative for activity change and appetite change.  HENT:  Negative for congestion.   Eyes:  Negative for visual disturbance.  Respiratory:  Negative for chest tightness and shortness of breath.   Cardiovascular:  Negative for chest pain and palpitations.  Gastrointestinal:  Negative for abdominal distention and abdominal pain.  Genitourinary:  Negative for difficulty urinating.  Musculoskeletal:  Negative for arthralgias and back pain.  Neurological: Negative.   Psychiatric/Behavioral: Negative.  Negative for dysphoric mood.     Objective:  BP 130/70    Pulse 92    Temp 97.9 F (  36.6 C)    Resp 15    Ht 5\' 1"  (1.549 m)    Wt 151 lb (68.5 kg)    LMP  (LMP Unknown)    SpO2 98%    BMI 28.53 kg/m   BP/Weight 03/12/2021 [id number]  Systolic BP [id number]  Diastolic BP 70 80 78  Wt. (Lbs) 151 160 165  BMI 28.53 30.23 31.18    Physical Exam Vitals reviewed.  Constitutional:      General: She is not in acute distress.    Appearance: Normal appearance.  HENT:     Head: Normocephalic.     Right Ear: Tympanic membrane, ear canal and external ear normal.     Left Ear: Tympanic membrane, ear canal and external ear normal.     Mouth/Throat:      Pharynx: Oropharynx is clear.  Eyes:     Extraocular Movements: Extraocular movements intact.     Conjunctiva/sclera: Conjunctivae normal.     Pupils: Pupils are equal, round, and reactive to light.  Cardiovascular:     Rate and Rhythm: Normal rate and regular rhythm.     Pulses: Normal pulses.     Heart sounds: Normal heart sounds. No murmur heard.   No gallop.  Pulmonary:     Effort: Pulmonary effort is normal. No respiratory distress.     Breath sounds: No wheezing.  Abdominal:     General: Abdomen is flat. Bowel sounds are normal. There is no distension.     Tenderness: There is no abdominal tenderness.  Musculoskeletal:     Cervical back: Normal range of motion and neck supple.     Right lower leg: No edema.     Left lower leg: No edema.  Skin:    General: Skin is warm.     Capillary Refill: Capillary refill takes less than 2 seconds.  Neurological:     General: No focal deficit present.     Mental Status: She is alert and oriented to person, place, and time. Mental status is at baseline.    Lab Results  Component Value Date   WBC 6.7 03/05/2020   HGB 14.3 03/05/2020   HCT 41.6 03/05/2020   PLT 381 03/05/2020   GLUCOSE 107 (H) 03/05/2020   CHOL 233 (H) 03/05/2020   TRIG 173 (H) 03/05/2020   HDL 61 03/05/2020   LDLCALC 141 (H) 03/05/2020   ALT 8 03/05/2020   AST 12 03/05/2020   NA 140 03/05/2020   K 4.5 03/05/2020   CL 101 03/05/2020   CREATININE 0.81 03/05/2020   BUN 10 03/05/2020   CO2 24 03/05/2020      Assessment & Plan:   Problem List Items Addressed This Visit       Cardiovascular and Mediastinum   Essential hypertension, benign - Primary   Relevant Orders   Comprehensive metabolic panel   CBC with Differential/Platelet An individual hypertension care plan was established and reinforced today.  The patient's status was assessed using clinical findings on exam and labs or diagnostic tests. The patient's success at meeting treatment goals on  disease specific evidence-based guidelines and found to be well controlled. SELF MANAGEMENT: The patient and I together assessed ways to personally work towards obtaining the recommended goals. RECOMMENDATIONS: avoid decongestants found in common cold remedies, decrease consumption of alcohol, perform routine monitoring of BP with home BP cuff, exercise, reduction of dietary salt, take medicines as prescribed, try not to miss doses and quit smoking.  Regular exercise and  maintaining a healthy weight is needed.  Stress reduction may help. A CLINICAL SUMMARY including written plan identify barriers to care unique to individual due to social or financial issues.  We attempt to mutually creat solutions for individual and family understanding.      Musculoskeletal and Integument   Osteopenia Patient has osteopenia and is due for another DXA     Other   Mixed hyperlipidemia   Relevant Orders   Lipid panel AN INDIVIDUAL CARE PLAN for hyperlipidemia/ cholesterol was established and reinforced today.  The patient's status was assessed using clinical findings on exam, lab and other diagnostic tests. The patient's disease status was assessed based on evidence-based guidelines and found to be fair controlled. MEDICATIONS were reviewed. SELF MANAGEMENT GOALS have been discussed and patient's success at attaining the goal of low cholesterol was assessed. RECOMMENDATION given include regular exercise 3 days a week and low cholesterol/low fat diet. CLINICAL SUMMARY including written plan to identify barriers unique to the patient due to social or economic  reasons was discussed.    Nicotine dependence Patient continues to smoke and we have spoken about means in which she can quit she is still precontemplative at the present time but will still consider possible cessation in the future    Depression, major, single episode, moderate ([place]) Patient's depression is doing well her PHQ is 2 and she has no depressive  affect at the present time.   Other Visit Diagnoses     Menopausal state       Relevant Orders   DG BONE DENSITY (DXA)   Encounter for screening mammogram for malignant neoplasm of breast       Relevant Orders   MM Digital Screening     30 minute visit with review of records    Body mass index is 28.53 kg/m.   These are the goals we discussed:  Goals      Increase physical activity         This is a list of the screening recommended for you and due dates:  Health Maintenance  Topic Date Due   Mammogram  Never done   DEXA scan (bone density measurement)  Never done   Zoster (Shingles) Vaccine (1 of 2) 06/09/2021*   Colon Cancer Screening  03/12/2022*   Tetanus Vaccine  03/12/2022*   Hepatitis C Screening: USPSTF Recommendation to screen - Ages 18-79 yo.  03/12/2022*   COVID-19 Vaccine (1) 09/21/2022*   Pneumonia Vaccine  Completed   Flu Shot  Completed   HPV Vaccine  Aged Out  *Topic was postponed. The date shown is not the original due date.     Meds ordered this encounter  Medications   naproxen (NAPROSYN) 500 MG tablet    Sig: Take 1 tablet (500 mg total) by mouth 2 (two) times daily as needed.    Dispense:  60 tablet    Refill:  2    Follow-up: No follow-ups on file.  An After Visit Summary was printed and given to the patient.  Reinaldo Meeker, MD [facility] [phone]

## 2021-03-13 LAB — LIPID PANEL
Chol/HDL Ratio: 3.4 ratio (ref 0.0–4.4)
Cholesterol, Total: 253 mg/dL — ABNORMAL HIGH (ref 100–199)
HDL: 74 mg/dL (ref >39)
LDL Chol Calc (NIH): 154 mg/dL — ABNORMAL HIGH (ref 0–99)
Triglycerides: 142 mg/dL (ref 0–149)
VLDL Cholesterol Cal: 25 mg/dL (ref 5–40)

## 2021-03-13 LAB — COMPREHENSIVE METABOLIC PANEL
ALT: 8 IU/L (ref 0–32)
AST: 17 IU/L (ref 0–40)
Albumin/Globulin Ratio: 2.4 — ABNORMAL HIGH (ref 1.2–2.2)
Albumin: 4.7 g/dL (ref 3.7–4.7)
Alkaline Phosphatase: 93 IU/L (ref 44–121)
BUN/Creatinine Ratio: 10 — ABNORMAL LOW (ref 12–28)
BUN: 7 mg/dL — ABNORMAL LOW (ref 8–27)
Bilirubin Total: 0.3 mg/dL (ref 0.0–1.2)
CO2: 24 mmol/L (ref 20–29)
Calcium: 9.9 mg/dL (ref 8.7–10.3)
Chloride: 100 mmol/L (ref 96–106)
Creatinine, Ser: 0.71 mg/dL (ref 0.57–1.00)
Globulin, Total: 2 g/dL (ref 1.5–4.5)
Glucose: 99 mg/dL (ref 70–99)
Potassium: 4.7 mmol/L (ref 3.5–5.2)
Sodium: 138 mmol/L (ref 134–144)
Total Protein: 6.7 g/dL (ref 6.0–8.5)
eGFR: 91 mL/min/{1.73_m2} (ref >59)

## 2021-03-13 LAB — CBC WITH DIFFERENTIAL/PLATELET
Basophils Absolute: 0.1 10*3/uL (ref 0.0–0.2)
Basos: 1 %
EOS (ABSOLUTE): 0.1 10*3/uL (ref 0.0–0.4)
Eos: 1 %
Hematocrit: 44.3 % (ref 34.0–46.6)
Hemoglobin: 15.2 g/dL (ref 11.1–15.9)
Immature Grans (Abs): 0 10*3/uL (ref 0.0–0.1)
Immature Granulocytes: 0 %
Lymphocytes Absolute: 2.2 10*3/uL (ref 0.7–3.1)
Lymphs: 30 %
MCH: 32.4 pg (ref 26.6–33.0)
MCHC: 34.3 g/dL (ref 31.5–35.7)
MCV: 95 fL (ref 79–97)
Monocytes Absolute: 0.5 10*3/uL (ref 0.1–0.9)
Monocytes: 7 %
Neutrophils Absolute: 4.4 10*3/uL (ref 1.4–7.0)
Neutrophils: 61 %
Platelets: 391 10*3/uL (ref 150–450)
RBC: 4.69 x10E6/uL (ref 3.77–5.28)
RDW: 13.2 % (ref 11.7–15.4)
WBC: 7.3 10*3/uL (ref 3.4–10.8)

## 2021-03-13 LAB — CARDIOVASCULAR RISK ASSESSMENT

## 2021-03-13 NOTE — Progress Notes (Signed)
Kidney tests normal, liver tests normal, LDL cholesterol high 154, CBC normal, need to consider statin to lower "bad" cholesterol lp

## 2021-03-14 ENCOUNTER — Other Ambulatory Visit: Payer: Self-pay | Admitting: Legal Medicine

## 2021-03-14 DIAGNOSIS — Z1231 Encounter for screening mammogram for malignant neoplasm of breast: Secondary | ICD-10-CM

## 2021-03-18 DIAGNOSIS — M8580 Other specified disorders of bone density and structure, unspecified site: Secondary | ICD-10-CM | POA: Diagnosis not present

## 2021-03-18 DIAGNOSIS — M8589 Other specified disorders of bone density and structure, multiple sites: Secondary | ICD-10-CM | POA: Diagnosis not present

## 2021-03-19 ENCOUNTER — Other Ambulatory Visit: Payer: Self-pay

## 2021-03-19 DIAGNOSIS — N951 Menopausal and female climacteric states: Secondary | ICD-10-CM

## 2021-04-23 ENCOUNTER — Ambulatory Visit (INDEPENDENT_AMBULATORY_CARE_PROVIDER_SITE_OTHER): Payer: Medicare Other

## 2021-04-23 VITALS — BP 122/78 | HR 92 | Resp 16 | Ht 61.0 in | Wt 151.2 lb

## 2021-04-23 DIAGNOSIS — Z0000 Encounter for general adult medical examination without abnormal findings: Secondary | ICD-10-CM

## 2021-04-23 NOTE — Progress Notes (Signed)
? ?Subjective:  ? Stephanie Mcdowell is a 71 y.o. female who presents for Medicare Annual (Subsequent) preventive examination.  This wellness visit is conducted by a nurse.  The patient's medications were reviewed and reconciled since the patient's last visit.  History details were provided by the  patient.  The history appears to be reliable.   ? ?Medical History: Patient history and Family history was reviewed  ?Medications, Allergies, and preventative health maintenance was reviewed and updated. ? ? ?Cardiac Risk Factors include: advanced age (>32men, >23 women);smoking/ tobacco exposure ? ?   ?Objective:  ?  ?Today's Vitals  ? 04/23/21 1550  ?BP: 122/78  ?Pulse: 92  ?Resp: 16  ?SpO2: 95%  ?Weight: 151 lb 3.2 oz (68.6 kg)  ?Height: 5\' 1"  (1.549 m)  ?PainSc: 0-No pain  ? ?Body mass index is 28.57 kg/m?. ? ?   ? View : No data to display.  ?  ?  ?  ? ? ?Current Medications (verified) ?Outpatient Encounter Medications as of 04/23/2021  ?Medication Sig  ? losartan (COZAAR) 50 MG tablet TAKE 1 TABLET(50 MG) BY MOUTH DAILY  ? Multiple Vitamins-Minerals (AIRBORNE PO) Take by mouth.  ? naproxen (NAPROSYN) 500 MG tablet Take 1 tablet (500 mg total) by mouth 2 (two) times daily as needed.  ? Probiotic Product (PROBIOTIC DAILY PO) Take by mouth.  ? ?No facility-administered encounter medications on file as of 04/23/2021.  ? ? ?Allergies (verified) ?Patient has no known allergies.  ? ?History: ?Past Medical History:  ?Diagnosis Date  ? Essential hypertension, benign 11/23/2019  ? Lumbar disc disease 09/21/2017  ? Formatting of this note might be different from the original. Taking shots in back.  ? Mild episode of recurrent major depressive disorder ([facility]) 09/21/2017  ? Mixed hyperlipidemia 07/30/2015  ? Nicotine dependence 11/23/2019  ? Primary osteoarthritis involving multiple joints 07/23/2015  ? ?Past Surgical History:  ?Procedure Laterality Date  ? APPENDECTOMY    ? CHOLECYSTECTOMY  2020  ? TOTAL HIP ARTHROPLASTY Left 09/16/2020   ? TUBAL LIGATION    ? ?Family History  ?Adopted: Yes  ?Problem Relation Age of Onset  ? Breast cancer Mother   ? Stroke Father   ? ?Social History  ? ?Socioeconomic History  ? Marital status: Widowed  ?  Spouse name: Not on file  ? Number of children: 1  ? Years of education: Not on file  ? Highest education level: Not on file  ?Occupational History  ? Occupation: Retired 09/18/2020  ?Tobacco Use  ? Smoking status: Every Day  ?  Packs/day: 0.50  ?  Years: 15.00  ?  Pack years: 7.50  ?  Types: Cigarettes  ? Smokeless tobacco: Never  ?Vaping Use  ? Vaping Use: Never used  ?Substance and Sexual Activity  ? Alcohol use: Not Currently  ? Drug use: Never  ? Sexual activity: Not Currently  ?Other Topics Concern  ? Not on file  ?Social History Narrative  ? Not on file  ? ?Social Determinants of Health  ? ?Financial Resource Strain: Not on file  ?Food Insecurity: No Food Insecurity  ? Worried About Social worker in the Last Year: Never true  ? Ran Out of Food in the Last Year: Never true  ?Transportation Needs: No Transportation Needs  ? Lack of Transportation (Medical): No  ? Lack of Transportation (Non-Medical): No  ?Physical Activity: Inactive  ? Days of Exercise per Week: 0 days  ? Minutes of Exercise per Session: 0 min  ?  Stress: No Stress Concern Present  ? Feeling of Stress : Only a little  ?Social Connections: Not on file  ? ? ?Tobacco Counseling ?Ready to quit: Not Answered ?Counseling given: Yes ? ? ?Clinical Intake: ? ?Pre-visit preparation completed: Yes ? ?Pain : No/denies pain ?Pain Score: 0-No pain ? ?  ? ?BMI - recorded: 28.57 ?Nutritional Status: BMI 25 -29 Overweight ?Nutritional Risks: None ?Diabetes: No ? ?How often do you need to have someone help you when you read instructions, pamphlets, or other written materials from your doctor or pharmacy?: 1 - Never ?Interpreter Needed?: No ?  ? ?Activities of Daily Living ? ?  04/23/2021  ?  4:03 PM  ?In your present state of health, do you have any  difficulty performing the following activities:  ?Hearing? 0  ?Vision? 0  ?Difficulty concentrating or making decisions? 0  ?Walking or climbing stairs? 0  ?Dressing or bathing? 0  ?Doing errands, shopping? 0  ?Preparing Food and eating ? N  ?Using the Toilet? N  ?In the past six months, have you accidently leaked urine? N  ?Do you have problems with loss of bowel control? N  ?Managing your Medications? N  ?Managing your Finances? N  ?Housekeeping or managing your Housekeeping? N  ? ? ?Patient Care Team: ?Abigail MiyamotoPerry, Lawrence Edward, MD as PCP - General (Family Medicine) ?Garnette Gunnerurrani, Shakeel, MD as Consulting Physician (Orthopedic Surgery) ? ?Indicate any recent Medical Services you may have received from other than [facility] providers in the past year (date may be approximate). ? ?   ?Assessment:  ? This is a routine wellness examination for Stephanie Mcdowell. ? ?Hearing/Vision screen ?No results found. ? ?Dietary issues and exercise activities discussed: ?Current Exercise Habits: The patient does not participate in regular exercise at present ? ?Depression Screen ? ?  04/23/2021  ?  3:59 PM 03/12/2021  ?  9:04 AM 09/04/2020  ?  9:09 AM 03/05/2020  ?  8:37 AM 11/23/2019  ? 11:01 AM 11/23/2019  ? 10:01 AM  ?PHQ 2/9 Scores  ?PHQ - 2 Score 0 2 0 0 3 2  ?PHQ- 9 Score 0 2 2 0 6 4  ?  ?Fall Risk ? ?  03/12/2021  ?  9:03 AM 11/23/2019  ? 10:01 AM  ?Fall Risk   ?Falls in the past year? 1 1  ?Number falls in past yr: 0 0  ?Injury with Fall? 0 1  ?Risk for fall due to : Other (Comment)   ?Follow up Falls prevention discussed   ? ? ?FALL RISK PREVENTION PERTAINING TO THE HOME: ? ?Any stairs in or around the home? No  ?If so, are there any without handrails? No  ?Home free of loose throw rugs in walkways, pet beds, electrical cords, etc? Yes  ?Adequate lighting in your home to reduce risk of falls? Yes  ? ?ASSISTIVE DEVICES UTILIZED TO PREVENT FALLS: ? ?Life alert? No  ?Use of a cane, walker or w/c? No  ?Grab bars in the bathroom? Yes  ?Shower chair or bench  in shower? Yes  ?Elevated toilet seat or a handicapped toilet? Yes  ? ?Gait slow and steady without use of assistive device ? ?Cognitive Function: ?  ?  ? ?  04/23/2021  ?  4:04 PM  ?6CIT Screen  ?What Year? 0 points  ?What month? 0 points  ?What time? 0 points  ?Count back from 20 0 points  ?Months in reverse 0 points  ?Repeat phrase 0 points  ?Total Score 0 points  ? ? ?  Immunizations ?Immunization History  ?Administered Date(s) Administered  ? Fluad Quad(high Dose 65+) 11/23/2019  ? Influenza,inj,Quad PF,6+ Mos 04/05/2018, 10/31/2018  ? Influenza-Unspecified 11/14/2020  ? Pneumococcal Conjugate-13 07/30/2015  ? Pneumococcal Polysaccharide-23 09/07/2016  ? Tdap 02/03/2004  ? ? ?TDAP status: Due, Education has been provided regarding the importance of this vaccine. Advised may receive this vaccine at local pharmacy or Health Dept. Aware to provide a copy of the vaccination record if obtained from local pharmacy or Health Dept. Verbalized acceptance and understanding. ? ?Flu Vaccine status: Up to date ? ?Pneumococcal vaccine status: Up to date ? ?Covid-19 vaccine status: Declined, Education has been provided regarding the importance of this vaccine but patient still declined. Advised may receive this vaccine at local pharmacy or Health Dept.or vaccine clinic. Aware to provide a copy of the vaccination record if obtained from local pharmacy or Health Dept. Verbalized acceptance and understanding. ? ? ?Screening Tests ?Health Maintenance  ?Topic Date Due  ? MAMMOGRAM  Never done  ? TETANUS/TDAP  03/12/2022 (Originally 02/02/2014)  ? Hepatitis C Screening  03/12/2022 (Originally 01/26/1969)  ? COLONOSCOPY (Pts 45-76yrs Insurance coverage will need to be confirmed)  11/17/2023  ? Pneumonia Vaccine 44+ Years old  Completed  ? INFLUENZA VACCINE  Completed  ? DEXA SCAN  Completed  ? HPV VACCINES  Aged Out  ? COVID-19 Vaccine  Discontinued  ? Zoster Vaccines- Shingrix  Discontinued  ? ? ?Health Maintenance ? ?Health Maintenance  Due  ?Topic Date Due  ? MAMMOGRAM  Never done  ? ? ?Colorectal cancer screening: Type of screening: Colonoscopy. Completed 11/2018. Repeat every 5 years ? ?Mammogram status: Ordered Scheduled for 04/30/21. Pt

## 2021-04-23 NOTE — Patient Instructions (Signed)

## 2021-04-30 ENCOUNTER — Ambulatory Visit
Admission: RE | Admit: 2021-04-30 | Discharge: 2021-04-30 | Disposition: A | Discharge status: Home / Self Care | Payer: Medicare Other | Source: Ambulatory Visit | Attending: Legal Medicine | Admitting: Legal Medicine

## 2021-04-30 ENCOUNTER — Other Ambulatory Visit: Payer: Self-pay

## 2021-04-30 DIAGNOSIS — Z1231 Encounter for screening mammogram for malignant neoplasm of breast: Secondary | ICD-10-CM | POA: Diagnosis not present

## 2021-05-01 NOTE — Progress Notes (Signed)
Normal mammogram ?lp

## 2021-07-20 ENCOUNTER — Other Ambulatory Visit: Payer: Self-pay | Admitting: Legal Medicine

## 2021-07-20 DIAGNOSIS — I10 Essential (primary) hypertension: Secondary | ICD-10-CM

## 2021-07-21 DIAGNOSIS — H52223 Regular astigmatism, bilateral: Secondary | ICD-10-CM | POA: Diagnosis not present

## 2021-07-21 DIAGNOSIS — H2513 Age-related nuclear cataract, bilateral: Secondary | ICD-10-CM | POA: Diagnosis not present

## 2021-07-30 ENCOUNTER — Other Ambulatory Visit: Payer: Self-pay | Admitting: Legal Medicine

## 2021-09-09 NOTE — Progress Notes (Signed)
Subjective:  Patient ID: Stephanie Mcdowell, female    DOB: [birth year]  Age: 71 y.o. MRN: [id number]  Chief Complaint  Patient presents with   Hypertension   Hyperlipidemia    HPI: chronic  Patient presents with hyperlipidemia.  Compliance with treatment has been good; maintains low cholesterol diet, follows up as directed, and maintains exercise regimen.    Patient presents for follow up of hypertension.  Patient tolerating Losartan 50 mg daily well without side effects.  Patient is working on maintaining diet and exercise regimen and follows up as directed.   Current Outpatient Medications on File Prior to Visit  Medication Sig Dispense Refill   losartan (COZAAR) 50 MG tablet TAKE 1 TABLET(50 MG) BY MOUTH DAILY 90 tablet 1   Multiple Vitamins-Minerals (AIRBORNE PO) Take by mouth.     Probiotic Product (PROBIOTIC DAILY PO) Take by mouth.     No current facility-administered medications on file prior to visit.   Past Medical History:  Diagnosis Date   Essential hypertension, benign 11/23/2019   Lumbar disc disease 09/21/2017   Formatting of this note might be different from the original. Taking shots in back.   Mild episode of recurrent major depressive disorder (HCC) 09/21/2017   Mixed hyperlipidemia 07/30/2015   Nicotine dependence 11/23/2019   Primary osteoarthritis involving multiple joints 07/23/2015   Past Surgical History:  Procedure Laterality Date   APPENDECTOMY     CHOLECYSTECTOMY  2020   TOTAL HIP ARTHROPLASTY Left 09/16/2020   TUBAL LIGATION      Family History  Adopted: Yes  Problem Relation Age of Onset   Stroke Father    Breast cancer Neg Hx    Social History   Socioeconomic History   Marital status: Widowed    Spouse name: Not on file   Number of children: 1   Years of education: Not on file   Highest education level: Not on file  Occupational History   Occupation: Retired Social worker  Tobacco Use   Smoking status: Every Day    Packs/day: 0.50     Years: 15.00    Total pack years: 7.50    Types: Cigarettes   Smokeless tobacco: Never  Vaping Use   Vaping Use: Never used  Substance and Sexual Activity   Alcohol use: Not Currently   Drug use: Never   Sexual activity: Not Currently  Other Topics Concern   Not on file  Social History Narrative   Not on file   Social Determinants of Health   Financial Resource Strain: Not on file  Food Insecurity: No Food Insecurity (04/23/2021)   Hunger Vital Sign    Worried About Running Out of Food in the Last Year: Never true    Ran Out of Food in the Last Year: Never true  Transportation Needs: No Transportation Needs (04/23/2021)   PRAPARE - Administrator, Civil Service (Medical): No    Lack of Transportation (Non-Medical): No  Physical Activity: Inactive (04/23/2021)   Exercise Vital Sign    Days of Exercise per Week: 0 days    Minutes of Exercise per Session: 0 min  Stress: No Stress Concern Present (04/23/2021)   [employer] of Occupational Health - Occupational Stress Questionnaire    Feeling of Stress : Only a little  Social Connections: Not on file    Review of Systems  Constitutional:  Negative for chills, fatigue and fever.  HENT:  Negative for congestion, ear pain and sore throat.   Eyes:  Negative for visual disturbance.  Respiratory:  Negative for cough and shortness of breath.   Cardiovascular:  Negative for chest pain and palpitations.  Gastrointestinal:  Negative for abdominal pain, constipation, diarrhea, nausea and vomiting.  Endocrine: Negative for polydipsia, polyphagia and polyuria.  Genitourinary:  Negative for difficulty urinating and dysuria.  Musculoskeletal:  Negative for arthralgias, back pain and myalgias.  Skin:  Negative for rash.  Neurological:  Negative for headaches.  Psychiatric/Behavioral:  Negative for dysphoric mood. The patient is not nervous/anxious.      Objective:  BP 100/70   Pulse 95   Temp 98.3 F (36.8 C)   Resp  15   Ht 5\' 1"  (1.549 m)   Wt 150 lb (68 kg)   LMP  (LMP Unknown)   SpO2 95%   BMI 28.34 kg/m      09/10/2021   10:00 AM 04/23/2021    3:50 PM 03/12/2021    9:02 AM  BP/Weight  Systolic BP 100 122 130  Diastolic BP 70 78 70  Wt. (Lbs) 150 151.2 151  BMI 28.34 kg/m2 28.57 kg/m2 28.53 kg/m2    Physical Exam Vitals reviewed.  Constitutional:      General: She is not in acute distress.    Appearance: Normal appearance.  HENT:     Head: Normocephalic and atraumatic.     Right Ear: Tympanic membrane normal.     Left Ear: Tympanic membrane normal.     Nose: Nose normal.     Mouth/Throat:     Mouth: Mucous membranes are moist.     Pharynx: Oropharynx is clear.  Eyes:     Extraocular Movements: Extraocular movements intact.     Conjunctiva/sclera: Conjunctivae normal.     Pupils: Pupils are equal, round, and reactive to light.  Cardiovascular:     Rate and Rhythm: Normal rate and regular rhythm.     Pulses: Normal pulses.     Heart sounds: Normal heart sounds. No murmur heard.    No gallop.  Pulmonary:     Effort: Pulmonary effort is normal. No respiratory distress.     Breath sounds: Normal breath sounds. No wheezing.  Abdominal:     General: Abdomen is flat. Bowel sounds are normal. There is no distension.     Tenderness: There is no abdominal tenderness.  Musculoskeletal:        General: Normal range of motion.     Cervical back: Normal range of motion.     Right lower leg: No edema.     Left lower leg: No edema.  Skin:    General: Skin is warm.     Capillary Refill: Capillary refill takes less than 2 seconds.  Neurological:     General: No focal deficit present.     Mental Status: She is alert and oriented to person, place, and time. Mental status is at baseline.     Gait: Gait normal.  Psychiatric:        Mood and Affect: Mood normal.        Thought Content: Thought content normal.        Lab Results  Component Value Date   WBC 7.9 09/10/2021   HGB 14.8  09/10/2021   HCT 42.9 09/10/2021   PLT 393 09/10/2021   GLUCOSE 103 (H) 09/10/2021   CHOL 257 (H) 09/10/2021   TRIG 153 (H) 09/10/2021   HDL 66 09/10/2021   LDLCALC 164 (H) 09/10/2021   ALT 10 09/10/2021   AST 18 09/10/2021  NA 135 09/10/2021   K 4.5 09/10/2021   CL 98 09/10/2021   CREATININE 0.78 09/10/2021   BUN 5 (L) 09/10/2021   CO2 24 09/10/2021      Assessment & Plan:   Problem List Items Addressed This Visit       Cardiovascular and Mediastinum   Essential hypertension, benign - Primary   Relevant Orders   Comprehensive metabolic panel (Completed)   CBC with Differential/Platelet (Completed) An individual hypertension care plan was established and reinforced today.  The patient's status was assessed using clinical findings on exam and labs or diagnostic tests. The patient's success at meeting treatment goals on disease specific evidence-based guidelines and found to be well controlled. SELF MANAGEMENT: The patient and I together assessed ways to personally work towards obtaining the recommended goals. RECOMMENDATIONS: avoid decongestants found in common cold remedies, decrease consumption of alcohol, perform routine monitoring of BP with home BP cuff, exercise, reduction of dietary salt, take medicines as prescribed, try not to miss doses and quit smoking.  Regular exercise and maintaining a healthy weight is needed.  Stress reduction may help. A CLINICAL SUMMARY including written plan identify barriers to care unique to individual due to social or financial issues.  We attempt to mutually creat solutions for individual and family understanding.      Musculoskeletal and Integument   Lumbar disc disease Chronic lumbar disc disease with pain    Osteopenia Patient has osteopenia and on calcium and vitamin d    Primary osteoarthritis involving multiple joints   Relevant Medications   naproxen (NAPROSYN) 500 MG tablet Patient has chronic primary osteoarthritis of  multiple joints and is controlled with Naprosyn.     Other   Mixed hyperlipidemia   Relevant Orders   Lipid panel (Completed) AN INDIVIDUAL CARE PLAN for hyperlipidemia/ cholesterol was established and reinforced today.  The patient's status was assessed using clinical findings on exam, lab and other diagnostic tests. The patient's disease status was assessed based on evidence-based guidelines and found to be fair controlled. MEDICATIONS were reviewed. SELF MANAGEMENT GOALS have been discussed and patient's success at attaining the goal of low cholesterol was assessed. RECOMMENDATION given include regular exercise 3 days a week and low cholesterol/low fat diet. CLINICAL SUMMARY including written plan to identify barriers unique to the patient due to social or economic  reasons was discussed.     Nicotine dependence We discussed smoking cessatin and aides to quit but patient does not want to quit yet    BMI 28.0-28.9,adult We discussed diet and weight loss  .  Meds ordered this encounter  Medications   naproxen (NAPROSYN) 500 MG tablet    Sig: Take 1 tablet (500 mg total) by mouth 2 (two) times daily with a meal.    Dispense:  60 tablet    Refill:  2    Orders Placed This Encounter  Procedures   Comprehensive metabolic panel   Lipid panel   CBC with Differential/Platelet   Cardiovascular Risk Assessment     Follow-up: Return in about 6 months (around 03/13/2022).  An After Visit Summary was printed and given to the patient.  Brent Bulla, MD [facility] [phone]

## 2021-09-10 ENCOUNTER — Encounter: Payer: Self-pay | Admitting: Legal Medicine

## 2021-09-10 ENCOUNTER — Ambulatory Visit (INDEPENDENT_AMBULATORY_CARE_PROVIDER_SITE_OTHER): Payer: Medicare Other | Admitting: Legal Medicine

## 2021-09-10 VITALS — BP 100/70 | HR 95 | Temp 98.3°F | Resp 15 | Ht 61.0 in | Wt 150.0 lb

## 2021-09-10 DIAGNOSIS — E782 Mixed hyperlipidemia: Secondary | ICD-10-CM | POA: Diagnosis not present

## 2021-09-10 DIAGNOSIS — M519 Unspecified thoracic, thoracolumbar and lumbosacral intervertebral disc disorder: Secondary | ICD-10-CM | POA: Diagnosis not present

## 2021-09-10 DIAGNOSIS — Z6828 Body mass index (BMI) 28.0-28.9, adult: Secondary | ICD-10-CM

## 2021-09-10 DIAGNOSIS — I10 Essential (primary) hypertension: Secondary | ICD-10-CM

## 2021-09-10 DIAGNOSIS — F17218 Nicotine dependence, cigarettes, with other nicotine-induced disorders: Secondary | ICD-10-CM

## 2021-09-10 DIAGNOSIS — M159 Polyosteoarthritis, unspecified: Secondary | ICD-10-CM | POA: Diagnosis not present

## 2021-09-10 DIAGNOSIS — M8580 Other specified disorders of bone density and structure, unspecified site: Secondary | ICD-10-CM | POA: Diagnosis not present

## 2021-09-10 MED ORDER — NAPROXEN 500 MG PO TABS
500.0000 mg | ORAL_TABLET | Freq: Two times a day (BID) | ORAL | 2 refills | Status: DC
Start: 2021-09-10 — End: 2022-01-07

## 2021-09-11 LAB — COMPREHENSIVE METABOLIC PANEL
ALT: 10 IU/L (ref 0–32)
AST: 18 IU/L (ref 0–40)
Albumin/Globulin Ratio: 2 (ref 1.2–2.2)
Albumin: 4.4 g/dL (ref 3.8–4.8)
Alkaline Phosphatase: 91 IU/L (ref 44–121)
BUN/Creatinine Ratio: 6 — ABNORMAL LOW (ref 12–28)
BUN: 5 mg/dL — ABNORMAL LOW (ref 8–27)
Bilirubin Total: 0.4 mg/dL (ref 0.0–1.2)
CO2: 24 mmol/L (ref 20–29)
Calcium: 9.7 mg/dL (ref 8.7–10.3)
Chloride: 98 mmol/L (ref 96–106)
Creatinine, Ser: 0.78 mg/dL (ref 0.57–1.00)
Globulin, Total: 2.2 g/dL (ref 1.5–4.5)
Glucose: 103 mg/dL — ABNORMAL HIGH (ref 70–99)
Potassium: 4.5 mmol/L (ref 3.5–5.2)
Sodium: 135 mmol/L (ref 134–144)
Total Protein: 6.6 g/dL (ref 6.0–8.5)
eGFR: 81 mL/min/{1.73_m2} (ref >59)

## 2021-09-11 LAB — CBC WITH DIFFERENTIAL/PLATELET
Basophils Absolute: 0.1 10*3/uL (ref 0.0–0.2)
Basos: 1 %
EOS (ABSOLUTE): 0.1 10*3/uL (ref 0.0–0.4)
Eos: 1 %
Hematocrit: 42.9 % (ref 34.0–46.6)
Hemoglobin: 14.8 g/dL (ref 11.1–15.9)
Immature Grans (Abs): 0 10*3/uL (ref 0.0–0.1)
Immature Granulocytes: 0 %
Lymphocytes Absolute: 2.1 10*3/uL (ref 0.7–3.1)
Lymphs: 27 %
MCH: 32.5 pg (ref 26.6–33.0)
MCHC: 34.5 g/dL (ref 31.5–35.7)
MCV: 94 fL (ref 79–97)
Monocytes Absolute: 0.6 10*3/uL (ref 0.1–0.9)
Monocytes: 7 %
Neutrophils Absolute: 5 10*3/uL (ref 1.4–7.0)
Neutrophils: 64 %
Platelets: 393 10*3/uL (ref 150–450)
RBC: 4.55 x10E6/uL (ref 3.77–5.28)
RDW: 12.9 % (ref 11.7–15.4)
WBC: 7.9 10*3/uL (ref 3.4–10.8)

## 2021-09-11 LAB — LIPID PANEL
Chol/HDL Ratio: 3.9 ratio (ref 0.0–4.4)
Cholesterol, Total: 257 mg/dL — ABNORMAL HIGH (ref 100–199)
HDL: 66 mg/dL (ref >39)
LDL Chol Calc (NIH): 164 mg/dL — ABNORMAL HIGH (ref 0–99)
Triglycerides: 153 mg/dL — ABNORMAL HIGH (ref 0–149)
VLDL Cholesterol Cal: 27 mg/dL (ref 5–40)

## 2021-09-11 LAB — CARDIOVASCULAR RISK ASSESSMENT

## 2021-09-11 NOTE — Progress Notes (Signed)
Glucose 103, kidney tests normal, liver tests normal, triglycerides 153 high, LDL cholesterol 164 high, cbc normal, needs treatment of cholesterol, send DASH diet and needs to consider a statin, diet alone will not be enough lp

## 2022-01-07 ENCOUNTER — Other Ambulatory Visit: Payer: Self-pay | Admitting: Legal Medicine

## 2022-01-09 ENCOUNTER — Other Ambulatory Visit: Payer: Self-pay

## 2022-01-09 DIAGNOSIS — I10 Essential (primary) hypertension: Secondary | ICD-10-CM

## 2022-01-09 MED ORDER — LOSARTAN POTASSIUM 50 MG PO TABS: ORAL_TABLET | ORAL | 1 refills | Status: DC

## 2022-03-16 ENCOUNTER — Encounter: Payer: Self-pay | Admitting: Physician Assistant

## 2022-03-16 ENCOUNTER — Ambulatory Visit (INDEPENDENT_AMBULATORY_CARE_PROVIDER_SITE_OTHER): Payer: Medicare Other | Admitting: Physician Assistant

## 2022-03-16 VITALS — BP 128/80 | HR 98 | Temp 97.0°F | Ht 61.0 in | Wt 151.2 lb

## 2022-03-16 DIAGNOSIS — I10 Essential (primary) hypertension: Secondary | ICD-10-CM | POA: Diagnosis not present

## 2022-03-16 DIAGNOSIS — F17218 Nicotine dependence, cigarettes, with other nicotine-induced disorders: Secondary | ICD-10-CM

## 2022-03-16 DIAGNOSIS — M519 Unspecified thoracic, thoracolumbar and lumbosacral intervertebral disc disorder: Secondary | ICD-10-CM

## 2022-03-16 DIAGNOSIS — M159 Polyosteoarthritis, unspecified: Secondary | ICD-10-CM | POA: Diagnosis not present

## 2022-03-16 DIAGNOSIS — E782 Mixed hyperlipidemia: Secondary | ICD-10-CM | POA: Diagnosis not present

## 2022-03-16 DIAGNOSIS — Z5320 Procedure and treatment not carried out because of patient's decision for unspecified reasons: Secondary | ICD-10-CM

## 2022-03-16 DIAGNOSIS — E559 Vitamin D deficiency, unspecified: Secondary | ICD-10-CM

## 2022-03-16 DIAGNOSIS — M150 Primary generalized (osteo)arthritis: Secondary | ICD-10-CM

## 2022-03-16 DIAGNOSIS — R739 Hyperglycemia, unspecified: Secondary | ICD-10-CM

## 2022-03-16 DIAGNOSIS — Z1231 Encounter for screening mammogram for malignant neoplasm of breast: Secondary | ICD-10-CM

## 2022-03-16 MED ORDER — LOSARTAN POTASSIUM 50 MG PO TABS: ORAL_TABLET | ORAL | 1 refills | Status: DC

## 2022-03-16 MED ORDER — EZETIMIBE 10 MG PO TABS: 10.0000 mg | ORAL_TABLET | Freq: Every day | ORAL | 3 refills | Status: DC

## 2022-03-16 MED ORDER — NAPROXEN 500 MG PO TABS: ORAL_TABLET | ORAL | 1 refills | Status: DC

## 2022-03-16 NOTE — Progress Notes (Signed)
Established Patient Office Visit  Subjective:  Patient ID: Stephanie Mcdowell, female    DOB: [birth year]  Age: 72 y.o. MRN: [id number]  CC:  Chief Complaint  Patient presents with   Hypertension   PT NEW TO ME - DR Henrene Pastor PT HPI Gaetana Wheelhouse presents for chronic follow up  Pt presents for follow up of hypertension. The patient is tolerating the medication well without side effects. Compliance with treatment has been good; including taking medication as directed , maintains a healthy diet and regular exercise regimen , and following up as directed. Currently taking losartan 34m qd  Pt with history of chronic low back pain and osteoarthritis - uses naprosyn qd  Pt taking otc vit D - due to check labwork  It was noted on patient labwork that she has had several readings of slightly elevated glucose - will get cmp and hgb A1c  Pt with history of hyperlipidemia.  She refuses statin treatment - states it caused her legs to ache.  I did discuss Praluent and Repatha - pt declines citing she is afraid of needles.  She is agreeable to try Zetia  Pt would like screening mammogram scheduled in April  Pt is a smoker of 1/2 ppd.  She has been smoking for more than 50 years.  She states that she refuses inhalers and will not try any - she states inhalers caused her husband to get throat cancer and she refuses treatment.  States her 'breathing is fine'  Past Medical History:  Diagnosis Date   Essential hypertension, benign 11/23/2019   Lumbar disc disease 09/21/2017   Formatting of this note might be different from the original. Taking shots in back.   Mild episode of recurrent major depressive disorder ([facility] 09/21/2017   Mixed hyperlipidemia 07/30/2015   Nicotine dependence 11/23/2019   Primary osteoarthritis involving multiple joints 07/23/2015    Past Surgical History:  Procedure Laterality Date   APPENDECTOMY     CHOLECYSTECTOMY  2020   TOTAL HIP ARTHROPLASTY Left 09/16/2020   TUBAL  LIGATION      Family History  Adopted: Yes  Problem Relation Age of Onset   Stroke Father    Breast cancer Neg Hx     Social History   Socioeconomic History   Marital status: Widowed    Spouse name: Not on file   Number of children: 1   Years of education: Not on file   Highest education level: Not on file  Occupational History   Occupation: Retired HProbation officer Tobacco Use   Smoking status: Every Day    Packs/day: 0.50    Years: 15.00    Total pack years: 7.50    Types: Cigarettes   Smokeless tobacco: Never  Vaping Use   Vaping Use: Never used  Substance and Sexual Activity   Alcohol use: Not Currently   Drug use: Never   Sexual activity: Not Currently  Other Topics Concern   Not on file  Social History Narrative   Not on file   Social Determinants of Health   Financial Resource Strain: Not on file  Food Insecurity: No Food Insecurity (04/23/2021)   Hunger Vital Sign    Worried About Running Out of Food in the Last Year: Never true    Ran Out of Food in the Last Year: Never true  Transportation Needs: No Transportation Needs (04/23/2021)   PRAPARE - THydrologist(Medical): No    Lack of Transportation (Non-Medical): No  Physical Activity: Inactive (04/23/2021)   Exercise Vital Sign    Days of Exercise per Week: 0 days    Minutes of Exercise per Session: 0 min  Stress: No Stress Concern Present (04/23/2021)   [place]    Feeling of Stress : Only a little  Social Connections: Not on file  Intimate Partner Violence: Not At Risk (04/23/2021)   Humiliation, Afraid, Rape, and Kick questionnaire    Fear of Current or Ex-Partner: No    Emotionally Abused: No    Physically Abused: No    Sexually Abused: No     Current Outpatient Medications:    B Complex-C (VITAMIN B + C COMPLEX PO), Take by mouth., Disp: , Rfl:    Cholecalciferol (VITAMIN D-3) 125 MCG (5000 UT)  TABS, Take by mouth., Disp: , Rfl:    ezetimibe (ZETIA) 10 MG tablet, Take 1 tablet (10 mg total) by mouth daily., Disp: 30 tablet, Rfl: 3   losartan (COZAAR) 50 MG tablet, TAKE 1 TABLET(50 MG) BY MOUTH DAILY, Disp: 90 tablet, Rfl: 1   naproxen (NAPROSYN) 500 MG tablet, TAKE 1 TABLET(500 MG) BY MOUTH TWICE DAILY WITH A MEAL, Disp: 180 tablet, Rfl: 1   No Known Allergies  ROS CONSTITUTIONAL: Negative for chills, fatigue, fever, unintentional weight gain and unintentional weight loss.  E/N/T: Negative for ear pain, nasal congestion and sore throat.  CARDIOVASCULAR: Negative for chest pain, dizziness, palpitations and pedal edema.  RESPIRATORY: has a chronic cough GASTROINTESTINAL: Negative for abdominal pain, acid reflux symptoms, constipation, diarrhea, nausea and vomiting.  MSK: see HPI INTEGUMENTARY: Negative for rash.  PSYCHIATRIC: Negative for sleep disturbance and to question depression screen.  Negative for depression, negative for anhedonia.        Objective:    PHYSICAL EXAM:   VS: BP 128/80 (BP Location: Left Arm, Patient Position: Sitting, Cuff Size: Normal)   Pulse 98   Temp (!) 97 F (36.1 C) (Temporal)   Ht 5' 1"$  (1.549 m)   Wt 151 lb 3.2 oz (68.6 kg)   LMP  (LMP Unknown)   SpO2 96%   BMI 28.57 kg/m   GEN: Well nourished, well developed, in no acute distress  Cardiac: RRR; no murmurs, rubs, or gallops,no edema - Respiratory: faint scattered wheezes MS: no deformity or atrophy  Skin: warm and dry, no rash  Neuro:  Alert and Oriented x 3, - CN II-Xii grossly intact Psych: euthymic mood, appropriate affect and demeanor    Health Maintenance Due  Topic Date Due   DTaP/Tdap/Td (2 - Td or Tdap) 02/02/2014   Medicare Annual Wellness (AWV)  04/24/2022    There are no preventive care reminders to display for this patient.  No results found for: "TSH" Lab Results  Component Value Date   WBC 7.9 09/10/2021   HGB 14.8 09/10/2021   HCT 42.9 09/10/2021   MCV  94 09/10/2021   PLT 393 09/10/2021   Lab Results  Component Value Date   NA 135 09/10/2021   K 4.5 09/10/2021   CO2 24 09/10/2021   GLUCOSE 103 (H) 09/10/2021   BUN 5 (L) 09/10/2021   CREATININE 0.78 09/10/2021   BILITOT 0.4 09/10/2021   ALKPHOS 91 09/10/2021   AST 18 09/10/2021   ALT 10 09/10/2021   PROT 6.6 09/10/2021   ALBUMIN 4.4 09/10/2021   CALCIUM 9.7 09/10/2021   EGFR 81 09/10/2021   Lab Results  Component Value Date   CHOL 257 (H)  09/10/2021   Lab Results  Component Value Date   HDL 66 09/10/2021   Lab Results  Component Value Date   LDLCALC 164 (H) 09/10/2021   Lab Results  Component Value Date   TRIG 153 (H) 09/10/2021   Lab Results  Component Value Date   CHOLHDL 3.9 09/10/2021   No results found for: "HGBA1C"    Assessment & Plan:   Problem List Items Addressed This Visit       Cardiovascular and Mediastinum   Essential hypertension, benign - Primary   Relevant Medications   losartan (COZAAR) 50 MG tablet   ezetimibe (ZETIA) 10 MG tablet Continue meds   Other Relevant Orders   CBC with Differential/Platelet   Comprehensive metabolic panel   Lipid panel     Musculoskeletal and Integument   Lumbar disc disease   Relevant Medications   naproxen (NAPROSYN) 500 MG tablet   Primary osteoarthritis involving multiple joints   Relevant Medications   naproxen (NAPROSYN) 500 MG tablet     Other   Mixed hyperlipidemia   Relevant Medications   losartan (COZAAR) 50 MG tablet   ezetimibe (ZETIA) 10 MG tablet Low fat/low chol diet   Other Relevant Orders   CBC with Differential/Platelet   Comprehensive metabolic panel   Lipid panel   Nicotine dependence   Relevant Orders   CT CHEST LUNG CA SCREEN LOW DOSE W/O CM   Vitamin D deficiency   Relevant Orders   VITAMIN D 25 Hydroxy (Vit-D Deficiency, Fractures)   Hyperglycemia   Relevant Orders   Hemoglobin A1c   Statin declined   Other Visit Diagnoses     Encounter for screening  mammogram for breast cancer       Relevant Orders   MM DIGITAL SCREENING BILATERAL       Meds ordered this encounter  Medications   losartan (COZAAR) 50 MG tablet    Sig: TAKE 1 TABLET(50 MG) BY MOUTH DAILY    Dispense:  90 tablet    Refill:  1    Order Specific Question:   Supervising Provider    Answer:   [facility], Elnita Maxwell [id number]   naproxen (NAPROSYN) 500 MG tablet    Sig: TAKE 1 TABLET(500 MG) BY MOUTH TWICE DAILY WITH A MEAL    Dispense:  180 tablet    Refill:  1    Order Specific Question:   Supervising Provider    AnswerShelton Silvas   ezetimibe (ZETIA) 10 MG tablet    Sig: Take 1 tablet (10 mg total) by mouth daily.    Dispense:  30 tablet    Refill:  3    Order Specific Question:   Supervising Provider    AnswerShelton Silvas    Follow-up: Return in about 6 months (around 09/14/2022) for chronic fasting follow up.    SARA R Rachit Grim, PA-C

## 2022-03-17 ENCOUNTER — Telehealth: Payer: Self-pay | Admitting: Physician Assistant

## 2022-03-17 LAB — COMPREHENSIVE METABOLIC PANEL
ALT: 10 IU/L (ref 0–32)
AST: 18 IU/L (ref 0–40)
Albumin/Globulin Ratio: 1.9 (ref 1.2–2.2)
Albumin: 4.4 g/dL (ref 3.8–4.8)
Alkaline Phosphatase: 94 IU/L (ref 44–121)
BUN/Creatinine Ratio: 12 (ref 12–28)
BUN: 8 mg/dL (ref 8–27)
Bilirubin Total: 0.4 mg/dL (ref 0.0–1.2)
CO2: 22 mmol/L (ref 20–29)
Calcium: 9.5 mg/dL (ref 8.7–10.3)
Chloride: 103 mmol/L (ref 96–106)
Creatinine, Ser: 0.68 mg/dL (ref 0.57–1.00)
Globulin, Total: 2.3 g/dL (ref 1.5–4.5)
Glucose: 95 mg/dL (ref 70–99)
Potassium: 4.8 mmol/L (ref 3.5–5.2)
Sodium: 142 mmol/L (ref 134–144)
Total Protein: 6.7 g/dL (ref 6.0–8.5)
eGFR: 92 mL/min/{1.73_m2} (ref >59)

## 2022-03-17 LAB — CBC WITH DIFFERENTIAL/PLATELET
Basophils Absolute: 0.1 10*3/uL (ref 0.0–0.2)
Basos: 1 %
EOS (ABSOLUTE): 0.1 10*3/uL (ref 0.0–0.4)
Eos: 2 %
Hematocrit: 45.6 % (ref 34.0–46.6)
Hemoglobin: 15.3 g/dL (ref 11.1–15.9)
Immature Grans (Abs): 0 10*3/uL (ref 0.0–0.1)
Immature Granulocytes: 0 %
Lymphocytes Absolute: 2 10*3/uL (ref 0.7–3.1)
Lymphs: 35 %
MCH: 31.9 pg (ref 26.6–33.0)
MCHC: 33.6 g/dL (ref 31.5–35.7)
MCV: 95 fL (ref 79–97)
Monocytes Absolute: 0.4 10*3/uL (ref 0.1–0.9)
Monocytes: 8 %
Neutrophils Absolute: 3.2 10*3/uL (ref 1.4–7.0)
Neutrophils: 54 %
Platelets: 400 10*3/uL (ref 150–450)
RBC: 4.79 x10E6/uL (ref 3.77–5.28)
RDW: 12.3 % (ref 11.7–15.4)
WBC: 5.8 10*3/uL (ref 3.4–10.8)

## 2022-03-17 LAB — LIPID PANEL
Chol/HDL Ratio: 3.3 ratio (ref 0.0–4.4)
Cholesterol, Total: 245 mg/dL — ABNORMAL HIGH (ref 100–199)
HDL: 74 mg/dL (ref >39)
LDL Chol Calc (NIH): 150 mg/dL — ABNORMAL HIGH (ref 0–99)
Triglycerides: 121 mg/dL (ref 0–149)
VLDL Cholesterol Cal: 21 mg/dL (ref 5–40)

## 2022-03-17 LAB — VITAMIN D 25 HYDROXY (VIT D DEFICIENCY, FRACTURES): Vit D, 25-Hydroxy: 47.1 ng/mL (ref 30.0–100.0)

## 2022-03-17 LAB — HEMOGLOBIN A1C
Est. average glucose Bld gHb Est-mCnc: 126 mg/dL
Hgb A1c MFr Bld: 6 % — ABNORMAL HIGH (ref 4.8–5.6)

## 2022-03-17 LAB — CARDIOVASCULAR RISK ASSESSMENT

## 2022-03-17 NOTE — Telephone Encounter (Signed)
   Stephanie Mcdowell has been scheduled for the following appointment:  WHAT: CT LUNG SCREEN WHERE: [facility] DATE: 03/20/22 TIME: 3:30 PM CHECK-IN  A message has been left for the patient.

## 2022-03-23 ENCOUNTER — Other Ambulatory Visit: Payer: Self-pay | Admitting: Family Medicine

## 2022-03-23 DIAGNOSIS — R918 Other nonspecific abnormal finding of lung field: Secondary | ICD-10-CM

## 2022-03-24 ENCOUNTER — Other Ambulatory Visit: Payer: Self-pay

## 2022-03-24 DIAGNOSIS — F17218 Nicotine dependence, cigarettes, with other nicotine-induced disorders: Secondary | ICD-10-CM

## 2022-04-09 ENCOUNTER — Ambulatory Visit: Payer: Medicare Other | Admitting: Physician Assistant

## 2022-05-13 ENCOUNTER — Inpatient Hospital Stay: Admission: RE | Admit: 2022-05-13 | Payer: Medicare Other | Source: Ambulatory Visit

## 2022-05-20 ENCOUNTER — Telehealth: Payer: Self-pay | Admitting: Physician Assistant

## 2022-05-20 NOTE — Telephone Encounter (Signed)
LEFT VM FOR PT TO CALL MAIN OFFICE TO GET APPT RS DUE TO PROVIDER OUT OF THE OFFICE

## 2022-08-20 ENCOUNTER — Telehealth: Payer: Self-pay

## 2022-08-20 NOTE — Telephone Encounter (Signed)
Unsuccessful attempt to reach patient on preferred number listed in notes for scheduled AWV. Left message on voicemail okay to reschedule. 

## 2022-08-29 IMAGING — MG MM DIGITAL SCREENING BILAT W/ TOMO AND CAD
8 series · 9 of 24 positions shown · non-contrast
Comparison: Previous exam(s).

CLINICAL DATA: Screening.

EXAM:
DIGITAL SCREENING BILATERAL MAMMOGRAM WITH TOMOSYNTHESIS AND CAD
TECHNIQUE: Bilateral screening digital craniocaudal and mediolateral oblique
mammograms were obtained. Bilateral screening digital breast
tomosynthesis was performed. The images were evaluated with
computer-aided detection.

[L CC synth-2D]
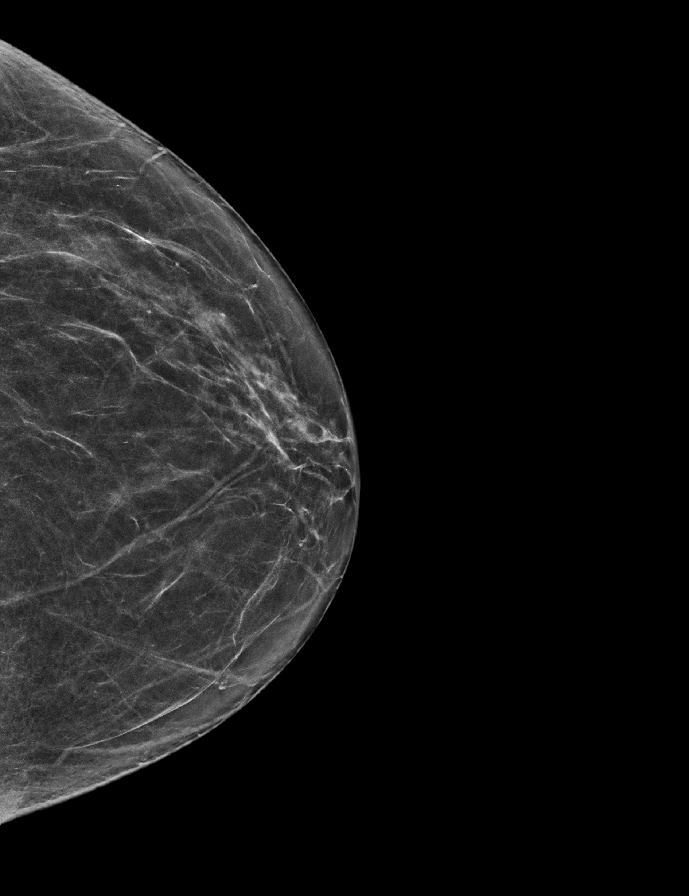

[R CC synth-2D]
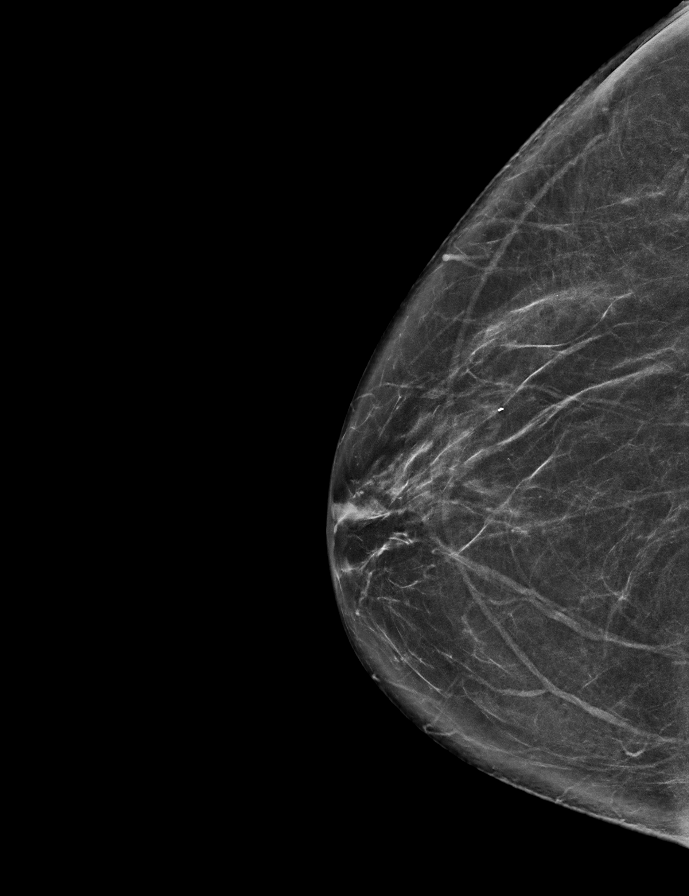

[R MLO synth-2D]
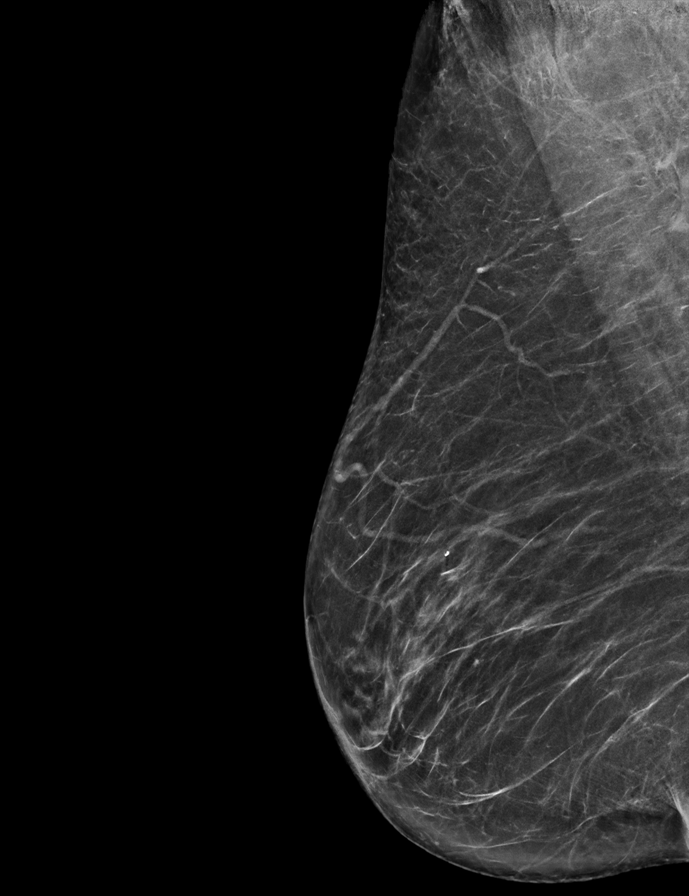

[L MLO synth-2D]
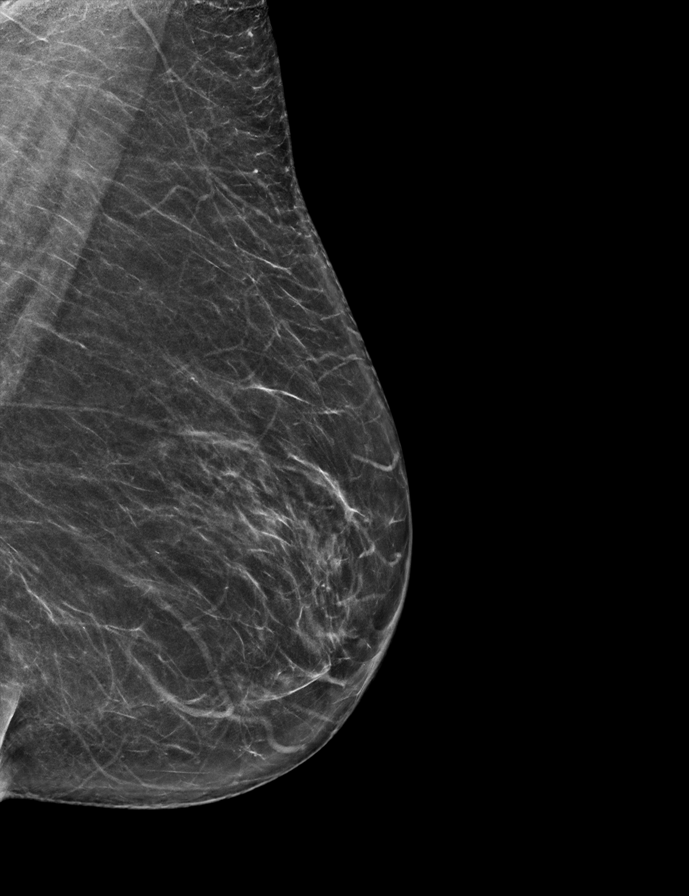

[R MLO tomo · 2 of 67 frames shown]
[frame 22/67]
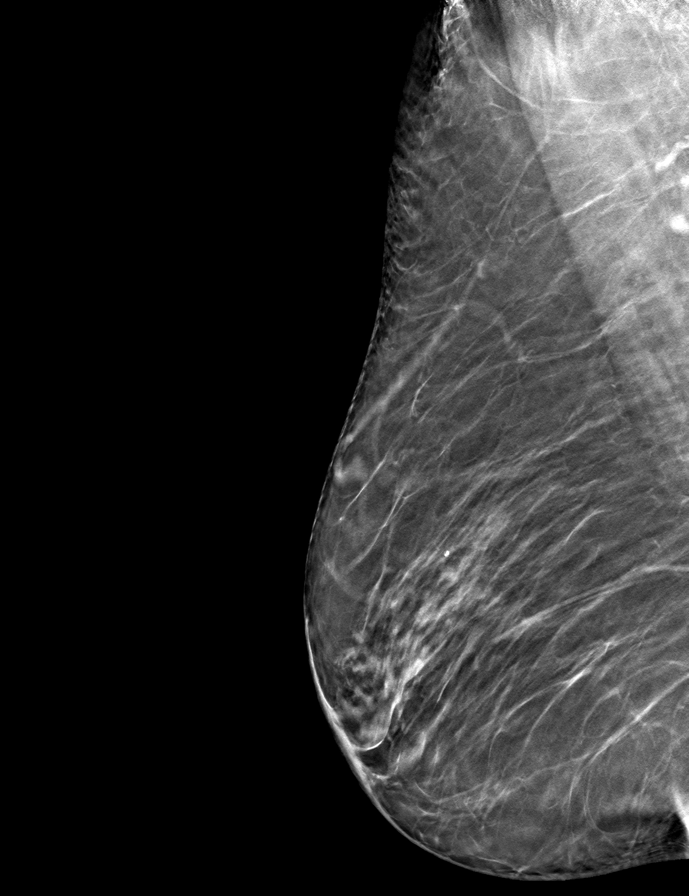
[frame 34/67]
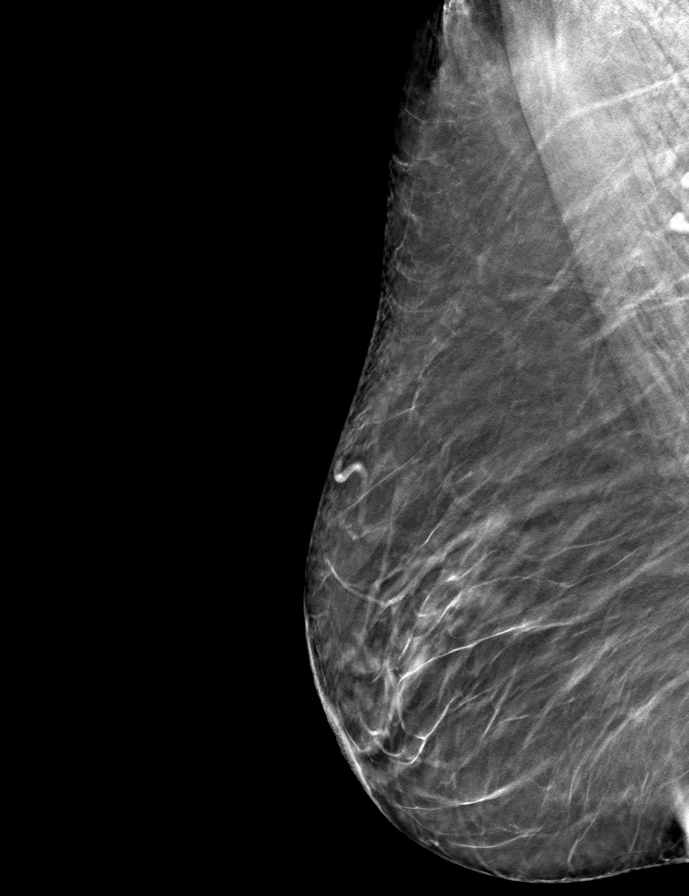

[R CC tomo · tomo slice 33/66.0]
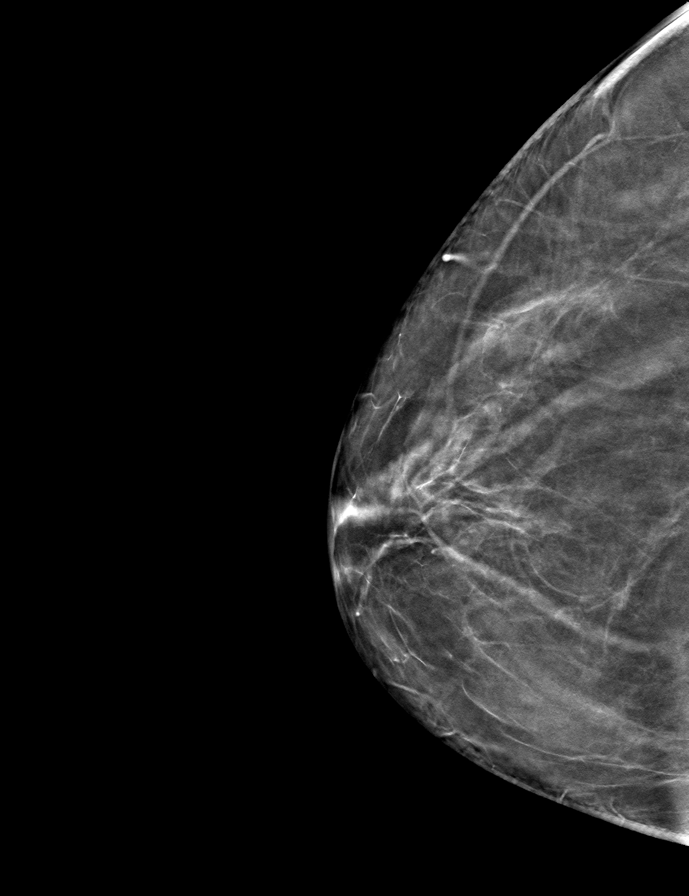

[L CC tomo · tomo slice 29/58.0]
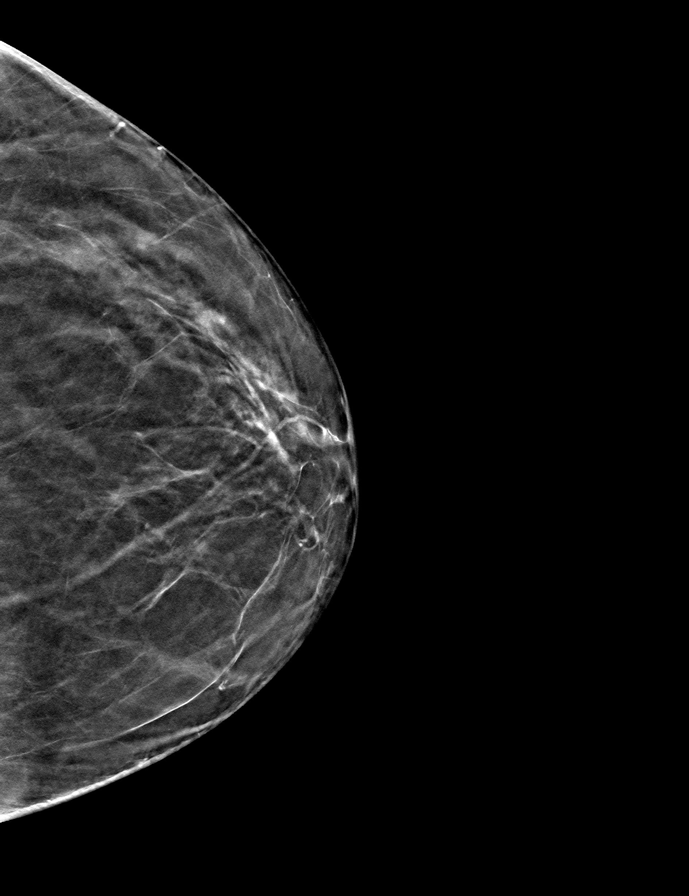

[L MLO tomo · tomo slice 33/65.0]
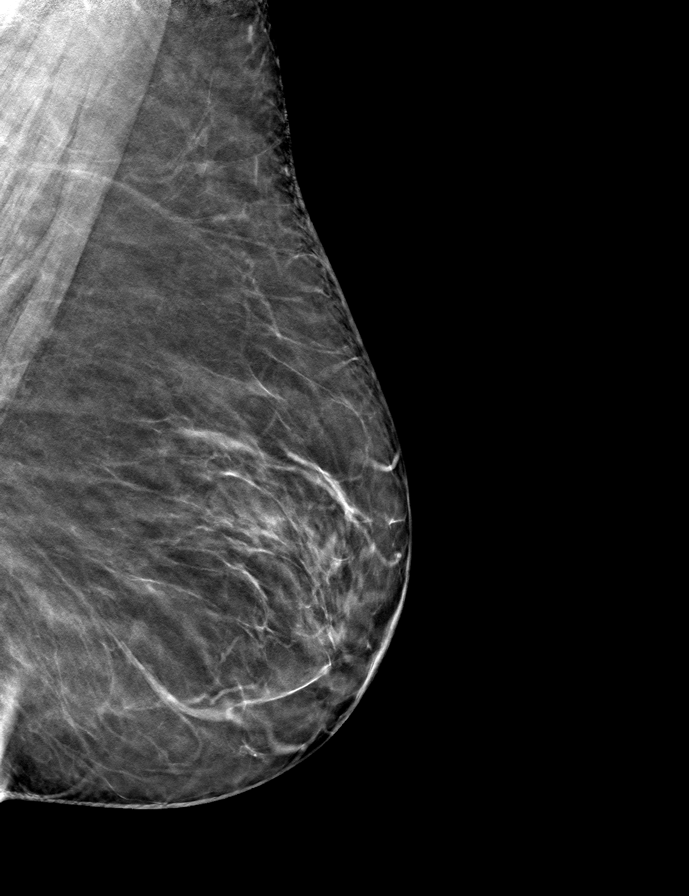

[9 of 24 positions shown; findings below may reference images not displayed]

ACR Breast Density Category b: There are scattered areas of
fibroglandular density.
FINDINGS: There are no findings suspicious for malignancy.
IMPRESSION: No mammographic evidence of malignancy. A result letter of this
screening mammogram will be mailed directly to the patient.

RECOMMENDATION:
Screening mammogram in one year. (Code:51-O-LD2)

BI-RADS CATEGORY  1: Negative.

## 2022-09-10 ENCOUNTER — Other Ambulatory Visit: Payer: Self-pay | Admitting: Physician Assistant

## 2022-09-10 DIAGNOSIS — I10 Essential (primary) hypertension: Secondary | ICD-10-CM

## 2022-09-16 ENCOUNTER — Ambulatory Visit: Payer: Medicare Other | Admitting: Physician Assistant

## 2022-09-20 ENCOUNTER — Other Ambulatory Visit: Payer: Self-pay | Admitting: Physician Assistant

## 2022-09-20 DIAGNOSIS — M519 Unspecified thoracic, thoracolumbar and lumbosacral intervertebral disc disorder: Secondary | ICD-10-CM

## 2022-09-20 DIAGNOSIS — M159 Polyosteoarthritis, unspecified: Secondary | ICD-10-CM

## 2022-10-01 ENCOUNTER — Encounter: Payer: Self-pay | Admitting: Physician Assistant

## 2022-10-01 ENCOUNTER — Ambulatory Visit (INDEPENDENT_AMBULATORY_CARE_PROVIDER_SITE_OTHER): Payer: Medicare Other | Admitting: Physician Assistant

## 2022-10-01 VITALS — BP 132/80 | HR 95 | Temp 97.0°F | Resp 15 | Ht 61.0 in | Wt 149.6 lb

## 2022-10-01 DIAGNOSIS — I10 Essential (primary) hypertension: Secondary | ICD-10-CM | POA: Diagnosis not present

## 2022-10-01 DIAGNOSIS — M519 Unspecified thoracic, thoracolumbar and lumbosacral intervertebral disc disorder: Secondary | ICD-10-CM

## 2022-10-01 DIAGNOSIS — E782 Mixed hyperlipidemia: Secondary | ICD-10-CM

## 2022-10-01 DIAGNOSIS — L03311 Cellulitis of abdominal wall: Secondary | ICD-10-CM

## 2022-10-01 DIAGNOSIS — R7303 Prediabetes: Secondary | ICD-10-CM | POA: Diagnosis not present

## 2022-10-01 DIAGNOSIS — E559 Vitamin D deficiency, unspecified: Secondary | ICD-10-CM

## 2022-10-01 DIAGNOSIS — Z1231 Encounter for screening mammogram for malignant neoplasm of breast: Secondary | ICD-10-CM

## 2022-10-01 DIAGNOSIS — R918 Other nonspecific abnormal finding of lung field: Secondary | ICD-10-CM

## 2022-10-01 DIAGNOSIS — Z5320 Procedure and treatment not carried out because of patient's decision for unspecified reasons: Secondary | ICD-10-CM

## 2022-10-01 DIAGNOSIS — J438 Other emphysema: Secondary | ICD-10-CM | POA: Insufficient documentation

## 2022-10-01 DIAGNOSIS — M67449 Ganglion, unspecified hand: Secondary | ICD-10-CM

## 2022-10-01 DIAGNOSIS — M159 Polyosteoarthritis, unspecified: Secondary | ICD-10-CM

## 2022-10-01 MED ORDER — LOSARTAN POTASSIUM 50 MG PO TABS: ORAL_TABLET | ORAL | 1 refills | Status: DC

## 2022-10-01 MED ORDER — CEPHALEXIN 500 MG PO CAPS
500.0000 mg | ORAL_CAPSULE | Freq: Two times a day (BID) | ORAL | 0 refills | Status: DC
Start: 2022-10-01 — End: 2022-10-13

## 2022-10-01 MED ORDER — NAPROXEN 500 MG PO TABS: ORAL_TABLET | ORAL | 1 refills | Status: DC

## 2022-10-01 NOTE — Progress Notes (Signed)
Established Patient Office Visit  Subjective:  Patient ID: Stephanie Mcdowell, female    DOB: 1950/03/25  Age: 72 y.o. MRN: [id number]  CC:  Chief Complaint  Patient presents with   Medical Management of Chronic Issues    HPI [name] presents for chronic follow up  Pt presents for follow up of hypertension. The patient is tolerating the medication well without side effects. Compliance with treatment has been good; including taking medication as directed , maintains a healthy diet and regular exercise regimen , and following up as directed. Currently taking losartan 50mg  qd  Pt with history of chronic low back pain and osteoarthritis - uses naprosyn qd  Pt taking otc vit D - due to check labwork  Pt with prediabetes - watching diet  Pt had bite from insect on stomach - the area with slight redness and itching  Pt with history of hyperlipidemia.  She refuses statin treatment - states it caused her legs to ache.  I did discuss Praluent and Repatha - pt declines citing she is afraid of needles.  She did try zetia but states that she did not tolerate that either and refuses other treatment  Pt missed her screening mammogram and is agreeable to schedule  Pt is a smoker of 1/2 ppd.  She has been smoking for more than 50 years.  She states that she refuses inhalers and will not try any - she states inhalers caused her husband to get throat cancer and she refuses treatment.  States her 'breathing is fine' She did have CT lung screening which showed emphysema and also irregular nodules -- was advised to get PET scan but she refuses.  I did discuss these findings could be consistent with lung cancer and also recommended pulmonology referral which she refuses.  Says she will 'die from something'  Past Medical History:  Diagnosis Date   Essential hypertension, benign 11/23/2019   Lumbar disc disease 09/21/2017   Formatting of this note might be different from the original. Taking shots in  back.   Mild episode of recurrent major depressive disorder (HCC) 09/21/2017   Mixed hyperlipidemia 07/30/2015   Nicotine dependence 11/23/2019   Primary osteoarthritis involving multiple joints 07/23/2015    Past Surgical History:  Procedure Laterality Date   APPENDECTOMY     CHOLECYSTECTOMY  2020   TOTAL HIP ARTHROPLASTY Left 09/16/2020   TUBAL LIGATION      Family History  Adopted: Yes  Problem Relation Age of Onset   Stroke Father    Breast cancer Neg Hx     Social History   Socioeconomic History   Marital status: Widowed    Spouse name: Not on file   Number of children: 1   Years of education: Not on file   Highest education level: Not on file  Occupational History   Occupation: Retired Social worker  Tobacco Use   Smoking status: Every Day    Current packs/day: 0.50    Average packs/day: 0.5 packs/day for 15.0 years (7.5 ttl pk-yrs)    Types: Cigarettes   Smokeless tobacco: Never  Vaping Use   Vaping status: Never Used  Substance and Sexual Activity   Alcohol use: Not Currently   Drug use: Never   Sexual activity: Not Currently  Other Topics Concern   Not on file  Social History Narrative   Not on file   Social Determinants of Health   Financial Resource Strain: Low Risk  (10/01/2022)   Overall Financial Resource Strain (CARDIA)  Difficulty of Paying Living Expenses: Not hard at all  Food Insecurity: No Food Insecurity (10/01/2022)   Hunger Vital Sign    Worried About Running Out of Food in the Last Year: Never true    Ran Out of Food in the Last Year: Never true  Transportation Needs: No Transportation Needs (10/01/2022)   PRAPARE - Administrator, Civil Service (Medical): No    Lack of Transportation (Non-Medical): No  Physical Activity: Inactive (10/01/2022)   Exercise Vital Sign    Days of Exercise per Week: 0 days    Minutes of Exercise per Session: 0 min  Stress: No Stress Concern Present (10/01/2022)   [employer] of  Occupational Health - Occupational Stress Questionnaire    Feeling of Stress : Not at all  Social Connections: Unknown (10/01/2022)   Social Connection and Isolation Panel [NHANES]    Frequency of Communication with Friends and Family: More than three times a week    Frequency of Social Gatherings with Friends and Family: Three times a week    Attends Religious Services: Patient declined    Active Member of Clubs or Organizations: No    Attends Banker Meetings: Never    Marital Status: Married  Catering manager Violence: Not At Risk (10/01/2022)   Humiliation, Afraid, Rape, and Kick questionnaire    Fear of Current or Ex-Partner: No    Emotionally Abused: No    Physically Abused: No    Sexually Abused: No     Current Outpatient Medications:    B Complex-C (VITAMIN B + C COMPLEX PO), Take by mouth., Disp: , Rfl:    cephALEXin (KEFLEX) 500 MG capsule, Take 1 capsule (500 mg total) by mouth 2 (two) times daily., Disp: 20 capsule, Rfl: 0   Cholecalciferol (VITAMIN D-3) 125 MCG (5000 UT) TABS, Take by mouth., Disp: , Rfl:    losartan (COZAAR) 50 MG tablet, TAKE 1 TABLET(50 MG) BY MOUTH DAILY, Disp: 90 tablet, Rfl: 1   naproxen (NAPROSYN) 500 MG tablet, TAKE 1 TABLET(500 MG) BY MOUTH TWICE DAILY WITH A MEAL, Disp: 180 tablet, Rfl: 1   Allergies  Allergen Reactions   Alendronate Nausea And Vomiting    CONSTITUTIONAL: Negative for chills, fatigue, fever, unintentional weight gain and unintentional weight loss.  E/N/T: Negative for ear pain, nasal congestion and sore throat.  CARDIOVASCULAR: Negative for chest pain, dizziness, palpitations and pedal edema.  RESPIRATORY: Negative for recent cough and dyspnea.  GASTROINTESTINAL: Negative for abdominal pain, acid reflux symptoms, constipation, diarrhea, nausea and vomiting.  MSK: ganglion cyst on finger - would like ortho referral INTEGUMENTARY: see HPI NEUROLOGICAL: Negative for dizziness and headaches.  PSYCHIATRIC: Negative  for sleep disturbance and to question depression screen.  Negative for depression, negative for anhedonia.        Objective:  PHYSICAL EXAM:   VS: BP 132/80 (BP Location: Left Arm, Patient Position: Sitting, Cuff Size: Normal)   Pulse 95   Temp (!) 97 F (36.1 C)   Resp 15   Ht 5\' 1"  (1.549 m)   Wt 149 lb 9.6 oz (67.9 kg)   LMP  (LMP Unknown)   SpO2 97%   BMI 28.27 kg/m   GEN: Well nourished, well developed, in no acute distress  HEENT: normal external ears and nose - normal external auditory canals and TMS - hearing grossly normal -  - Lips, Teeth and Gums - normal  Oropharynx - normal mucosa, palate, and posterior pharynx Cardiac: RRR; no murmurs, rubs,  or gallops,no edema -  Respiratory:  scattered rhonchi and mild wheezes noted MS: ganglion cyst noted on finger Skin: mild redness around small pustule on waist line Neuro:  Alert and Oriented x 3, Strength and sensation are intact - CN II-Xii grossly intact Psych: euthymic mood, appropriate affect and demeanor  Health Maintenance Due  Topic Date Due   DTaP/Tdap/Td (2 - Td or Tdap) 02/02/2014   Medicare Annual Wellness (AWV)  04/24/2022   MAMMOGRAM  05/01/2022    There are no preventive care reminders to display for this patient.  No results found for: "TSH" Lab Results  Component Value Date   WBC 5.8 03/16/2022   HGB 15.3 03/16/2022   HCT 45.6 03/16/2022   MCV 95 03/16/2022   PLT 400 03/16/2022   Lab Results  Component Value Date   NA 142 03/16/2022   K 4.8 03/16/2022   CO2 22 03/16/2022   GLUCOSE 95 03/16/2022   BUN 8 03/16/2022   CREATININE 0.68 03/16/2022   BILITOT 0.4 03/16/2022   ALKPHOS 94 03/16/2022   AST 18 03/16/2022   ALT 10 03/16/2022   PROT 6.7 03/16/2022   ALBUMIN 4.4 03/16/2022   CALCIUM 9.5 03/16/2022   EGFR 92 03/16/2022   Lab Results  Component Value Date   CHOL 245 (H) 03/16/2022   Lab Results  Component Value Date   HDL 74 03/16/2022   Lab Results  Component Value Date    LDLCALC 150 (H) 03/16/2022   Lab Results  Component Value Date   TRIG 121 03/16/2022   Lab Results  Component Value Date   CHOLHDL 3.3 03/16/2022   Lab Results  Component Value Date   HGBA1C 6.0 (H) 03/16/2022      Assessment & Plan:   Problem List Items Addressed This Visit       Cardiovascular and Mediastinum   Essential hypertension, benign - Primary   Relevant Medications   losartan (COZAAR) 50 MG tablet    Continue meds   Other Relevant Orders   CBC with Differential/Platelet   Comprehensive metabolic panel   Lipid panel     Musculoskeletal and Integument   Lumbar disc disease   Relevant Medications   naproxen (NAPROSYN) 500 MG tablet   Primary osteoarthritis involving multiple joints   Relevant Medications   naproxen (NAPROSYN) 500 MG tablet     Other   Mixed hyperlipidemia   Relevant Medications   losartan (COZAAR) 50 MG tablet   Pt refuses statin Low fat/low chol diet   Other Relevant Orders   CBC with Differential/Platelet   Comprehensive metabolic panel   Lipid panel   Nicotine dependence   Relevant Orders   Recommend stop smoking   Vitamin D deficiency   Relevant Orders   VITAMIN D 25 Hydroxy (Vit-D Deficiency, Fractures)   Hyperglycemia   Relevant Orders   Hemoglobin A1c   Statin declined   Other Visit Diagnoses   Ganglion cyst Refer to ortho  Cellulitis Rx for keflex 500mg  bid  Pulmonary nodules Pt REFUSES PET, PULMONOLOGY REFERRAL  EMPHYSEMA PT REFUSES ANY TREATMENT - NO INHALERS    Encounter for screening mammogram for breast cancer       Relevant Orders   MM DIGITAL SCREENING BILATERAL       Meds ordered this encounter  Medications   losartan (COZAAR) 50 MG tablet    Sig: TAKE 1 TABLET(50 MG) BY MOUTH DAILY    Dispense:  90 tablet    Refill:  1  Order Specific Question:   Supervising Provider    Answer:   Corey Harold   naproxen (NAPROSYN) 500 MG tablet    Sig: TAKE 1 TABLET(500 MG) BY MOUTH TWICE  DAILY WITH A MEAL    Dispense:  180 tablet    Refill:  1    Order Specific Question:   Supervising Provider    Answer:   Corey Harold   cephALEXin (KEFLEX) 500 MG capsule    Sig: Take 1 capsule (500 mg total) by mouth 2 (two) times daily.    Dispense:  20 capsule    Refill:  0    Order Specific Question:   Supervising Provider    Answer:   Corey Harold    Follow-up: Return in about 6 months (around 04/02/2023) for chronic fasting follow-up - and MCR well by phone with Selena Batten.    SARA R Eura Radabaugh, PA-C

## 2022-10-02 LAB — COMPREHENSIVE METABOLIC PANEL
ALT: 10 IU/L (ref 0–32)
AST: 17 IU/L (ref 0–40)
Albumin: 4.3 g/dL (ref 3.8–4.8)
Alkaline Phosphatase: 88 IU/L (ref 44–121)
BUN/Creatinine Ratio: 10 — ABNORMAL LOW (ref 12–28)
BUN: 7 mg/dL — ABNORMAL LOW (ref 8–27)
Bilirubin Total: 0.4 mg/dL (ref 0.0–1.2)
CO2: 25 mmol/L (ref 20–29)
Calcium: 9.4 mg/dL (ref 8.7–10.3)
Chloride: 102 mmol/L (ref 96–106)
Creatinine, Ser: 0.7 mg/dL (ref 0.57–1.00)
Globulin, Total: 2.1 g/dL (ref 1.5–4.5)
Glucose: 96 mg/dL (ref 70–99)
Potassium: 4.7 mmol/L (ref 3.5–5.2)
Sodium: 141 mmol/L (ref 134–144)
Total Protein: 6.4 g/dL (ref 6.0–8.5)
eGFR: 92 mL/min/{1.73_m2} (ref >59)

## 2022-10-02 LAB — LIPID PANEL
Chol/HDL Ratio: 3.2 ratio (ref 0.0–4.4)
Cholesterol, Total: 240 mg/dL — ABNORMAL HIGH (ref 100–199)
HDL: 76 mg/dL (ref >39)
LDL Chol Calc (NIH): 147 mg/dL — ABNORMAL HIGH (ref 0–99)
Triglycerides: 101 mg/dL (ref 0–149)
VLDL Cholesterol Cal: 17 mg/dL (ref 5–40)

## 2022-10-02 LAB — CBC WITH DIFFERENTIAL/PLATELET
Basophils Absolute: 0.1 10*3/uL (ref 0.0–0.2)
Basos: 2 %
EOS (ABSOLUTE): 0.1 10*3/uL (ref 0.0–0.4)
Eos: 2 %
Hematocrit: 43.4 % (ref 34.0–46.6)
Hemoglobin: 14.6 g/dL (ref 11.1–15.9)
Immature Grans (Abs): 0 10*3/uL (ref 0.0–0.1)
Immature Granulocytes: 0 %
Lymphocytes Absolute: 1.9 10*3/uL (ref 0.7–3.1)
Lymphs: 28 %
MCH: 32.9 pg (ref 26.6–33.0)
MCHC: 33.6 g/dL (ref 31.5–35.7)
MCV: 98 fL — ABNORMAL HIGH (ref 79–97)
Monocytes Absolute: 0.5 10*3/uL (ref 0.1–0.9)
Monocytes: 8 %
Neutrophils Absolute: 4.1 10*3/uL (ref 1.4–7.0)
Neutrophils: 60 %
Platelets: 396 10*3/uL (ref 150–450)
RBC: 4.44 x10E6/uL (ref 3.77–5.28)
RDW: 12.9 % (ref 11.7–15.4)
WBC: 6.7 10*3/uL (ref 3.4–10.8)

## 2022-10-02 LAB — HEMOGLOBIN A1C
Est. average glucose Bld gHb Est-mCnc: 117 mg/dL
Hgb A1c MFr Bld: 5.7 % — ABNORMAL HIGH (ref 4.8–5.6)

## 2022-10-02 LAB — VITAMIN D 25 HYDROXY (VIT D DEFICIENCY, FRACTURES): Vit D, 25-Hydroxy: 46.2 ng/mL (ref 30.0–100.0)

## 2022-10-02 LAB — TSH: TSH: 2.36 u[IU]/mL (ref 0.450–4.500)

## 2022-10-13 ENCOUNTER — Ambulatory Visit (INDEPENDENT_AMBULATORY_CARE_PROVIDER_SITE_OTHER): Payer: Medicare Other

## 2022-10-13 DIAGNOSIS — Z0000 Encounter for general adult medical examination without abnormal findings: Secondary | ICD-10-CM | POA: Diagnosis not present

## 2022-10-13 NOTE — Progress Notes (Signed)
Subjective:   Stephanie Mcdowell is a 72 y.o. female who presents for Medicare Annual (Subsequent) preventive examination.  This wellness visit is conducted by a nurse.  The patient's medications were reviewed and reconciled since the patient's last visit.  History details were provided by the patient.  The history appears to be reliable.    Medical History: Patient history and Family history was reviewed  Medications, Allergies, and preventative health maintenance was reviewed and updated.   Visit Complete: Virtual  I connected with  Grayling Congress on 10/13/22 by a audio enabled telemedicine application and verified that I am speaking with the correct person using two identifiers.  Patient Location: Home  Provider Location: Office/Clinic  I discussed the limitations of evaluation and management by telemedicine. The patient expressed understanding and agreed to proceed.  Cardiac Risk Factors include: advanced age (>68men, >11 women);smoking/ tobacco exposure     Objective:    Today's Vitals   10/13/22 1519  PainSc: 0-No pain  Patient was unable to self-report due to a lack of equipment at home via telehealth  There is no height or weight on file to calculate BMI.  Current Medications (verified) Outpatient Encounter Medications as of 10/13/2022  Medication Sig   B Complex-C (VITAMIN B + C COMPLEX PO) Take by mouth.   Cholecalciferol (VITAMIN D-3) 125 MCG (5000 UT) TABS Take by mouth.   losartan (COZAAR) 50 MG tablet TAKE 1 TABLET(50 MG) BY MOUTH DAILY   naproxen (NAPROSYN) 500 MG tablet TAKE 1 TABLET(500 MG) BY MOUTH TWICE DAILY WITH A MEAL   [DISCONTINUED] cephALEXin (KEFLEX) 500 MG capsule Take 1 capsule (500 mg total) by mouth 2 (two) times daily.   No facility-administered encounter medications on file as of 10/13/2022.   Allergies (verified) Alendronate   History: Past Medical History:  Diagnosis Date   Essential hypertension, benign 11/23/2019   Lumbar disc disease  09/21/2017   Formatting of this note might be different from the original. Taking shots in back.   Mild episode of recurrent major depressive disorder (HCC) 09/21/2017   Mixed hyperlipidemia 07/30/2015   Nicotine dependence 11/23/2019   Primary osteoarthritis involving multiple joints 07/23/2015   Past Surgical History:  Procedure Laterality Date   APPENDECTOMY     CHOLECYSTECTOMY  2020   TOTAL HIP ARTHROPLASTY Left 09/16/2020   TUBAL LIGATION     Family History  Adopted: Yes  Problem Relation Age of Onset   Breast cancer Neg Hx    Social History   Socioeconomic History   Marital status: Widowed    Spouse name: Not on file   Number of children: 1   Years of education: Not on file   Highest education level: Not on file  Occupational History   Occupation: Retired Social worker  Tobacco Use   Smoking status: Every Day    Current packs/day: 0.50    Average packs/day: 0.5 packs/day for 15.0 years (7.5 ttl pk-yrs)    Types: Cigarettes   Smokeless tobacco: Never  Vaping Use   Vaping status: Never Used  Substance and Sexual Activity   Alcohol use: Not Currently   Drug use: Never   Sexual activity: Not Currently  Other Topics Concern   Not on file  Social History Narrative   Not on file   Social Determinants of Health   Financial Resource Strain: Low Risk  (10/01/2022)   Overall Financial Resource Strain (CARDIA)    Difficulty of Paying Living Expenses: Not hard at all  Food Insecurity: No  Food Insecurity (10/01/2022)   Hunger Vital Sign    Worried About Running Out of Food in the Last Year: Never true    Ran Out of Food in the Last Year: Never true  Transportation Needs: No Transportation Needs (10/01/2022)   PRAPARE - Administrator, Civil Service (Medical): No    Lack of Transportation (Non-Medical): No  Physical Activity: Inactive (10/01/2022)   Exercise Vital Sign    Days of Exercise per Week: 0 days    Minutes of Exercise per Session: 0 min  Stress: No  Stress Concern Present (10/01/2022)   [employer] of Occupational Health - Occupational Stress Questionnaire    Feeling of Stress : Not at all  Social Connections: Unknown (10/01/2022)   Social Connection and Isolation Panel [NHANES]    Frequency of Communication with Friends and Family: More than three times a week    Frequency of Social Gatherings with Friends and Family: Three times a week    Attends Religious Services: Patient declined    Active Member of Clubs or Organizations: No    Attends Banker Meetings: Never    Marital Status: Married    Tobacco Counseling Ready to quit: Not Answered Counseling given: Not Answered   Clinical Intake:  Pre-visit preparation completed: Yes Pain : No/denies pain Pain Score: 0-No pain   BMI - recorded: 28.28 Nutritional Status: BMI 25 -29 Overweight Nutritional Risks: None Diabetes: No How often do you need to have someone help you when you read instructions, pamphlets, or other written materials from your doctor or pharmacy?: 1 - Never Interpreter Needed?: No    Activities of Daily Living    10/13/2022    3:05 PM  In your present state of health, do you have any difficulty performing the following activities:  Hearing? 0  Vision? 0  Difficulty concentrating or making decisions? 0  Walking or climbing stairs? 0  Dressing or bathing? 0  Doing errands, shopping? 0  Preparing Food and eating ? N  Using the Toilet? N  In the past six months, have you accidently leaked urine? N  Do you have problems with loss of bowel control? N  Managing your Medications? N  Managing your Finances? N  Housekeeping or managing your Housekeeping? N    Patient Care Team: Marianne Sofia, Cordelia Poche as PCP - General (Physician Assistant) Garnette Gunner, MD as Consulting Physician (Orthopedic Surgery)     Assessment:   This is a routine wellness examination for Stephanie Mcdowell.  Hearing/Vision screen No results found.   Depression  Screen    10/13/2022    3:29 PM 10/01/2022   11:05 AM 03/16/2022    8:54 AM 09/10/2021   10:05 AM 04/23/2021    3:59 PM 03/12/2021    9:04 AM 09/04/2020    9:09 AM  PHQ 2/9 Scores  PHQ - 2 Score 0 0 0 0 0 2 0  PHQ- 9 Score 0 0 0 0 0 2 2    Fall Risk    10/13/2022    3:28 PM 10/01/2022   11:05 AM 03/16/2022    8:54 AM 03/12/2021    9:03 AM 11/23/2019   10:01 AM  Fall Risk   Falls in the past year? 0 0 0 1 1  Number falls in past yr: 0 0 0 0 0  Injury with Fall? 0 0 0 0 1  Risk for fall due to : No Fall Risks No Fall Risks No Fall Risks Other (Comment)  Follow up Falls evaluation completed;Education provided Falls evaluation completed Falls evaluation completed Falls prevention discussed     MEDICARE RISK AT HOME: Medicare Risk at Home Any stairs in or around the home?: No If so, are there any without handrails?: No Home free of loose throw rugs in walkways, pet beds, electrical cords, etc?: Yes Adequate lighting in your home to reduce risk of falls?: Yes Life alert?: No Use of a cane, walker or w/c?: No Grab bars in the bathroom?: Yes Shower chair or bench in shower?: Yes Elevated toilet seat or a handicapped toilet?: Yes  TIMED UP AND GO:  Was the test performed?  No    Cognitive Function:        10/13/2022    3:29 PM 04/23/2021    4:04 PM  6CIT Screen  What Year? 0 points 0 points  What month? 0 points 0 points  What time? 0 points 0 points  Count back from 20 0 points 0 points  Months in reverse 0 points 0 points  Repeat phrase 0 points 0 points  Total Score 0 points 0 points    Immunizations Immunization History  Administered Date(s) Administered   Fluad Quad(high Dose 65+) 11/23/2019   Influenza,inj,Quad PF,6+ Mos 04/05/2018, 10/31/2018   Influenza-Unspecified 11/14/2020   Pneumococcal Conjugate-13 07/30/2015   Pneumococcal Polysaccharide-23 09/07/2016   Tdap 02/03/2004    TDAP status: Due, Education has been provided regarding the importance of this  vaccine. Advised may receive this vaccine at local pharmacy or Health Dept. Aware to provide a copy of the vaccination record if obtained from local pharmacy or Health Dept. Verbalized acceptance and understanding.  Flu Vaccine status: Due, Education has been provided regarding the importance of this vaccine. Advised may receive this vaccine at local pharmacy or Health Dept. Aware to provide a copy of the vaccination record if obtained from local pharmacy or Health Dept. Verbalized acceptance and understanding.  Pneumococcal vaccine status: Up to date  Covid-19 vaccine status: Declined, Education has been provided regarding the importance of this vaccine but patient still declined. Advised may receive this vaccine at local pharmacy or Health Dept.or vaccine clinic. Aware to provide a copy of the vaccination record if obtained from local pharmacy or Health Dept. Verbalized acceptance and understanding.  Qualifies for Shingles Vaccine? Yes   Zostavax completed No   Shingrix Completed?: No.    Education has been provided regarding the importance of this vaccine. Patient has been advised to call insurance company to determine out of pocket expense if they have not yet received this vaccine. Advised may also receive vaccine at local pharmacy or Health Dept. Verbalized acceptance and understanding.  Screening Tests Health Maintenance  Topic Date Due   DTaP/Tdap/Td (2 - Td or Tdap) 02/02/2014   Medicare Annual Wellness (AWV)  04/24/2022   MAMMOGRAM  05/01/2022   INFLUENZA VACCINE  05/03/2023 (Originally 09/03/2022)   DEXA SCAN  03/19/2023   Colonoscopy  11/17/2023   Pneumonia Vaccine 32+ Years old  Completed   HPV VACCINES  Aged Out   COVID-19 Vaccine  Discontinued   Hepatitis C Screening  Discontinued   Zoster Vaccines- Shingrix  Discontinued    Health Maintenance  Health Maintenance Due  Topic Date Due   DTaP/Tdap/Td (2 - Td or Tdap) 02/02/2014   Medicare Annual Wellness (AWV)  04/24/2022    MAMMOGRAM  05/01/2022    Colorectal cancer screening: Type of screening: Colonoscopy.  Mammogram status: scheduled  Bone Density status: Completed 03/2021. Results reflect: Bone  density results: OSTEOPENIA. Repeat every 2 years.  Lung Cancer Screening: (Low Dose CT Chest recommended if Age 55-80 years, 20 pack-year currently smoking OR have quit w/in 15years.) does not qualify.    Additional Screening:  Vision Screening: Recommended annual ophthalmology exams for early detection of glaucoma and other disorders of the eye. Is the patient up to date with their annual eye exam?  Yes   Dental Screening: Recommended annual dental exams for proper oral hygiene   Community Resource Referral / Chronic Care Management: CRR required this visit?  No   CCM required this visit?  No     Plan:     I have personally reviewed and noted the following in the patient's chart:   Medical and social history Use of alcohol, tobacco or illicit drugs  Current medications and supplements including opioid prescriptions.  Functional ability and status Nutritional status Physical activity Advanced directives List of other physicians Hospitalizations, surgeries, and ER visits in previous 12 months Vitals Screenings to include cognitive, depression, and falls Referrals and appointments  In addition, I have reviewed and discussed with patient certain preventive protocols, quality metrics, and best practice recommendations. A written personalized care plan for preventive services as well as general preventive health recommendations were provided to patient.     Jacklynn Bue, LPN   [id number]   After Visit Summary: (Mail) Due to this being a telephonic visit, the after visit summary with patients personalized plan was offered to patient via mail

## 2022-10-13 NOTE — Patient Instructions (Signed)
Stephanie Mcdowell , Thank you for taking time to come for your Medicare Wellness Visit. I appreciate your ongoing commitment to your health goals. Please review the following plan we discussed and let me know if I can assist you in the future.    This is a list of the screening recommended for you and due dates:  Health Maintenance  Topic Date Due   DTaP/Tdap/Td vaccine (2 - Td or Tdap) 02/02/2014   Mammogram  05/01/2022   Flu Shot  05/03/2023*   DEXA scan (bone density measurement)  03/19/2023   Medicare Annual Wellness Visit  10/13/2023   Colon Cancer Screening  11/17/2023   Pneumonia Vaccine  Completed   HPV Vaccine  Aged Out   COVID-19 Vaccine  Discontinued   Hepatitis C Screening  Discontinued   Zoster (Shingles) Vaccine  Discontinued  *Topic was postponed. The date shown is not the original due date.   Preventive Care 70 Years and Older, Female Preventive care refers to lifestyle choices and visits with your health care provider that can promote health and wellness. What does preventive care include? A yearly physical exam. This is also called an annual well check. Dental exams once or twice a year. Routine eye exams. Ask your health care provider how often you should have your eyes checked. Personal lifestyle choices, including: Daily care of your teeth and gums. Regular physical activity. Eating a healthy diet. Avoiding tobacco and drug use. Limiting alcohol use. Practicing safe sex. Taking low-dose aspirin every day. Taking vitamin and mineral supplements as recommended by your health care provider. What happens during an annual well check? The services and screenings done by your health care provider during your annual well check will depend on your age, overall health, lifestyle risk factors, and family history of disease. Counseling  Your health care provider may ask you questions about your: Alcohol use. Tobacco use. Drug use. Emotional well-being. Home and  relationship well-being. Sexual activity. Eating habits. History of falls. Memory and ability to understand (cognition). Work and work Astronomer. Reproductive health. Screening  You may have the following tests or measurements: Height, weight, and BMI. Blood pressure. Lipid and cholesterol levels. These may be checked every 5 years, or more frequently if you are over 73 years old. Skin check. Lung cancer screening. You may have this screening every year starting at age 31 if you have a 30-pack-year history of smoking and currently smoke or have quit within the past 15 years. Fecal occult blood test (FOBT) of the stool. You may have this test every year starting at age 58. Flexible sigmoidoscopy or colonoscopy. You may have a sigmoidoscopy every 5 years or a colonoscopy every 10 years starting at age 60. Hepatitis C blood test. Hepatitis B blood test. Sexually transmitted disease (STD) testing. Diabetes screening. This is done by checking your blood sugar (glucose) after you have not eaten for a while (fasting). You may have this done every 1-3 years. Bone density scan. This is done to screen for osteoporosis. You may have this done starting at age 90. Mammogram. This may be done every 1-2 years. Talk to your health care provider about how often you should have regular mammograms. Talk with your health care provider about your test results, treatment options, and if necessary, the need for more tests. Vaccines  Your health care provider may recommend certain vaccines, such as: Influenza vaccine. This is recommended every year. Tetanus, diphtheria, and acellular pertussis (Tdap, Td) vaccine. You may need a Td booster  every 10 years. Zoster vaccine. You may need this after age 36. Pneumococcal 13-valent conjugate (PCV13) vaccine. One dose is recommended after age 70. Pneumococcal polysaccharide (PPSV23) vaccine. One dose is recommended after age 45. Talk to your health care provider  about which screenings and vaccines you need and how often you need them. This information is not intended to replace advice given to you by your health care provider. Make sure you discuss any questions you have with your health care provider. Document Released: 02/15/2015 Document Revised: 10/09/2015 Document Reviewed: 11/20/2014 Elsevier Interactive Patient Education  2017 [employer].  Fall Prevention in the Home Falls can cause injuries. They can happen to people of all ages. There are many things you can do to make your home safe and to help prevent falls. What can I do on the outside of my home? Regularly fix the edges of walkways and driveways and fix any cracks. Remove anything that might make you trip as you walk through a door, such as a raised step or threshold. Trim any bushes or trees on the path to your home. Use bright outdoor lighting. Clear any walking paths of anything that might make someone trip, such as rocks or tools. Regularly check to see if handrails are loose or broken. Make sure that both sides of any steps have handrails. Any raised decks and porches should have guardrails on the edges. Have any leaves, snow, or ice cleared regularly. Use sand or salt on walking paths during winter. Clean up any spills in your garage right away. This includes oil or grease spills. What can I do in the bathroom? Use night lights. Install grab bars by the toilet and in the tub and shower. Do not use towel bars as grab bars. Use non-skid mats or decals in the tub or shower. If you need to sit down in the shower, use a plastic, non-slip stool. Keep the floor dry. Clean up any water that spills on the floor as soon as it happens. Remove soap buildup in the tub or shower regularly. Attach bath mats securely with double-sided non-slip rug tape. Do not have throw rugs and other things on the floor that can make you trip. What can I do in the bedroom? Use night lights. Make sure  that you have a light by your bed that is easy to reach. Do not use any sheets or blankets that are too big for your bed. They should not hang down onto the floor. Have a firm chair that has side arms. You can use this for support while you get dressed. Do not have throw rugs and other things on the floor that can make you trip. What can I do in the kitchen? Clean up any spills right away. Avoid walking on wet floors. Keep items that you use a lot in easy-to-reach places. If you need to reach something above you, use a strong step stool that has a grab bar. Keep electrical cords out of the way. Do not use floor polish or wax that makes floors slippery. If you must use wax, use non-skid floor wax. Do not have throw rugs and other things on the floor that can make you trip. What can I do with my stairs? Do not leave any items on the stairs. Make sure that there are handrails on both sides of the stairs and use them. Fix handrails that are broken or loose. Make sure that handrails are as long as the stairways. Check any carpeting to  make sure that it is firmly attached to the stairs. Fix any carpet that is loose or worn. Avoid having throw rugs at the top or bottom of the stairs. If you do have throw rugs, attach them to the floor with carpet tape. Make sure that you have a light switch at the top of the stairs and the bottom of the stairs. If you do not have them, ask someone to add them for you. What else can I do to help prevent falls? Wear shoes that: Do not have high heels. Have rubber bottoms. Are comfortable and fit you well. Are closed at the toe. Do not wear sandals. If you use a stepladder: Make sure that it is fully opened. Do not climb a closed stepladder. Make sure that both sides of the stepladder are locked into place. Ask someone to hold it for you, if possible. Clearly mark and make sure that you can see: Any grab bars or handrails. First and last steps. Where the edge of  each step is. Use tools that help you move around (mobility aids) if they are needed. These include: Canes. Walkers. Scooters. Crutches. Turn on the lights when you go into a dark area. Replace any light bulbs as soon as they burn out. Set up your furniture so you have a clear path. Avoid moving your furniture around. If any of your floors are uneven, fix them. If there are any pets around you, be aware of where they are. Review your medicines with your doctor. Some medicines can make you feel dizzy. This can increase your chance of falling. Ask your doctor what other things that you can do to help prevent falls. This information is not intended to replace advice given to you by your health care provider. Make sure you discuss any questions you have with your health care provider. Document Released: 11/15/2008 Document Revised: 06/27/2015 Document Reviewed: 02/23/2014 Elsevier Interactive Patient Education  2017 [employer].

## 2022-11-11 ENCOUNTER — Ambulatory Visit
Admission: RE | Admit: 2022-11-11 | Discharge: 2022-11-11 | Disposition: A | Discharge status: Home / Self Care | Payer: Medicare Other | Source: Ambulatory Visit | Attending: Physician Assistant | Admitting: Physician Assistant

## 2022-11-11 DIAGNOSIS — Z1231 Encounter for screening mammogram for malignant neoplasm of breast: Secondary | ICD-10-CM

## 2022-11-16 ENCOUNTER — Other Ambulatory Visit: Payer: Self-pay

## 2022-11-16 DIAGNOSIS — M150 Primary generalized (osteo)arthritis: Secondary | ICD-10-CM

## 2022-11-16 DIAGNOSIS — I10 Essential (primary) hypertension: Secondary | ICD-10-CM

## 2022-11-16 DIAGNOSIS — M519 Unspecified thoracic, thoracolumbar and lumbosacral intervertebral disc disorder: Secondary | ICD-10-CM

## 2022-11-16 MED ORDER — NAPROXEN 500 MG PO TABS: ORAL_TABLET | ORAL | 1 refills | Status: DC

## 2022-11-16 MED ORDER — LOSARTAN POTASSIUM 50 MG PO TABS: ORAL_TABLET | ORAL | 1 refills | Status: DC

## 2022-11-16 NOTE — Telephone Encounter (Signed)
Patient called asking for refills on her Losartan and the Naproxen, I did inform the patient that the last pharmacy it was sent to with refills was [facility] in [place] and she states that she called them requesting refills and stated that they claimed that ever since moving the store to across the road they have lost a bunch of refills for patient's that were on file and that she needed to call here for refills to be sent for them. Patient is requesting 90 days.

## 2023-04-02 ENCOUNTER — Ambulatory Visit: Payer: Medicare Other | Admitting: Physician Assistant

## 2023-04-08 ENCOUNTER — Telehealth: Payer: Self-pay | Admitting: Physician Assistant

## 2023-04-08 NOTE — Telephone Encounter (Signed)
 Left a message on patient's voicemail to call back and reschedule her appointment on 04/09/23 to a day next week.

## 2023-04-09 ENCOUNTER — Ambulatory Visit: Payer: Medicare Other | Admitting: Physician Assistant

## 2023-04-14 ENCOUNTER — Encounter: Payer: Self-pay | Admitting: Physician Assistant

## 2023-04-14 ENCOUNTER — Ambulatory Visit (INDEPENDENT_AMBULATORY_CARE_PROVIDER_SITE_OTHER): Admitting: Physician Assistant

## 2023-04-14 VITALS — BP 126/86 | HR 90 | Temp 97.5°F | Resp 18 | Ht 61.0 in | Wt 143.4 lb

## 2023-04-14 DIAGNOSIS — R7303 Prediabetes: Secondary | ICD-10-CM

## 2023-04-14 DIAGNOSIS — M519 Unspecified thoracic, thoracolumbar and lumbosacral intervertebral disc disorder: Secondary | ICD-10-CM

## 2023-04-14 DIAGNOSIS — M150 Primary generalized (osteo)arthritis: Secondary | ICD-10-CM

## 2023-04-14 DIAGNOSIS — E559 Vitamin D deficiency, unspecified: Secondary | ICD-10-CM

## 2023-04-14 DIAGNOSIS — J438 Other emphysema: Secondary | ICD-10-CM

## 2023-04-14 DIAGNOSIS — E782 Mixed hyperlipidemia: Secondary | ICD-10-CM

## 2023-04-14 DIAGNOSIS — R899 Unspecified abnormal finding in specimens from other organs, systems and tissues: Secondary | ICD-10-CM

## 2023-04-14 DIAGNOSIS — I10 Essential (primary) hypertension: Secondary | ICD-10-CM

## 2023-04-14 DIAGNOSIS — R5383 Other fatigue: Secondary | ICD-10-CM

## 2023-04-14 MED ORDER — LOSARTAN POTASSIUM 50 MG PO TABS: ORAL_TABLET | ORAL | 1 refills | Status: DC

## 2023-04-14 MED ORDER — NAPROXEN 500 MG PO TABS: ORAL_TABLET | ORAL | 1 refills | Status: DC

## 2023-04-14 NOTE — Progress Notes (Signed)
 Established Patient Office Visit  Subjective:  Patient ID: Stephanie Mcdowell, female    DOB: [birth year]  Age: 73 y.o. MRN: [id number]  CC:  Chief Complaint  Patient presents with   Medical Management of Chronic Issues    HPI Stephanie Mcdowell presents for chronic follow up  Pt presents for follow up of hypertension. The patient is tolerating the medication well without side effects. Compliance with treatment has been good; including taking medication as directed , maintains a healthy diet and regular exercise regimen , and following up as directed. Currently taking losartan 50mg  qd She denies chest pain or shortness of breath  Pt with history of chronic low back pain and osteoarthritis - uses naprosyn qd- requests refill today Pt is seeing specialist for this issue and states she will be considering back surgery in the near future  Pt taking otc vit D - due to check labwork  Pt with prediabetes - watching diet Due to check labwork  Pt with history of hyperlipidemia.  She refuses statin treatment - states it caused her legs to ache.  I did discuss Praluent and Repatha - pt declines citing she is afraid of needles.  She did try zetia but states that she did not tolerate that either and refuses other treatment  Pt is a smoker of 1/2 ppd.  She has been smoking for more than 50 years.  She states that she refuses inhalers and will not try any - she states inhalers caused her husband to get throat cancer and she refuses treatment.  States her 'breathing is fine' She did have CT lung screening in 2/24 which showed emphysema and also irregular nodules -- was advised to get PET scan but she refused  I did discuss these findings could be consistent with lung cancer and also recommended pulmonology referral which she refused - Her daughter is with her today and I discussed again --- will make a copy of report and let them review and discuss and see if she would like anything further done at this  time  Past Medical History:  Diagnosis Date   Essential hypertension, benign 11/23/2019   Lumbar disc disease 09/21/2017   Formatting of this note might be different from the original. Taking shots in back.   Mild episode of recurrent major depressive disorder (HCC) 09/21/2017   Mixed hyperlipidemia 07/30/2015   Nicotine dependence 11/23/2019   Primary osteoarthritis involving multiple joints 07/23/2015    Past Surgical History:  Procedure Laterality Date   APPENDECTOMY     CHOLECYSTECTOMY  2020   TOTAL HIP ARTHROPLASTY Left 09/16/2020   TUBAL LIGATION      Family History  Adopted: Yes  Problem Relation Age of Onset   Breast cancer Neg Hx     Social History   Socioeconomic History   Marital status: Widowed    Spouse name: Not on file   Number of children: 1   Years of education: Not on file   Highest education level: Not on file  Occupational History   Occupation: Retired Social worker  Tobacco Use   Smoking status: Every Day    Current packs/day: 0.50    Average packs/day: 0.5 packs/day for 15.0 years (7.5 ttl pk-yrs)    Types: Cigarettes   Smokeless tobacco: Never  Vaping Use   Vaping status: Never Used  Substance and Sexual Activity   Alcohol use: Not Currently   Drug use: Never   Sexual activity: Not Currently  Other Topics Concern   Not  on file  Social History Narrative   Not on file   Social Drivers of Health   Financial Resource Strain: Low Risk  (10/01/2022)   Overall Financial Resource Strain (CARDIA)    Difficulty of Paying Living Expenses: Not hard at all  Food Insecurity: No Food Insecurity (10/01/2022)   Hunger Vital Sign    Worried About Running Out of Food in the Last Year: Never true    Ran Out of Food in the Last Year: Never true  Transportation Needs: No Transportation Needs (10/01/2022)   PRAPARE - Administrator, Civil Service (Medical): No    Lack of Transportation (Non-Medical): No  Physical Activity: Inactive (10/01/2022)    Exercise Vital Sign    Days of Exercise per Week: 0 days    Minutes of Exercise per Session: 0 min  Stress: No Stress Concern Present (10/01/2022)   [employer] of Occupational Health - Occupational Stress Questionnaire    Feeling of Stress : Not at all  Social Connections: Unknown (10/01/2022)   Social Connection and Isolation Panel [NHANES]    Frequency of Communication with Friends and Family: More than three times a week    Frequency of Social Gatherings with Friends and Family: Three times a week    Attends Religious Services: Patient declined    Active Member of Clubs or Organizations: No    Attends Banker Meetings: Never    Marital Status: Married  Catering manager Violence: Not At Risk (10/01/2022)   Humiliation, Afraid, Rape, and Kick questionnaire    Fear of Current or Ex-Partner: No    Emotionally Abused: No    Physically Abused: No    Sexually Abused: No     Current Outpatient Medications:    B Complex-C (VITAMIN B + C COMPLEX PO), Take by mouth., Disp: , Rfl:    Cholecalciferol (VITAMIN D-3) 125 MCG (5000 UT) TABS, Take by mouth., Disp: , Rfl:    losartan (COZAAR) 50 MG tablet, TAKE 1 TABLET(50 MG) BY MOUTH DAILY, Disp: 90 tablet, Rfl: 1   naproxen (NAPROSYN) 500 MG tablet, TAKE 1 TABLET(500 MG) BY MOUTH TWICE DAILY WITH A MEAL, Disp: 180 tablet, Rfl: 1   Allergies  Allergen Reactions   Alendronate Nausea And Vomiting    CONSTITUTIONAL: has had some mild malaise - recently had stomach virus but feeling better E/N/T: Negative for ear pain, nasal congestion and sore throat.  CARDIOVASCULAR: Negative for chest pain, dizziness, palpitations and pedal edema.  RESPIRATORY: Negative for recent cough and dyspnea.  GASTROINTESTINAL: Negative for abdominal pain, acid reflux symptoms, constipation, diarrhea, nausea and vomiting.  MSK: Negative for arthralgias and myalgias.  INTEGUMENTARY: Negative for rash.  NEUROLOGICAL: Negative for dizziness and  headaches.  PSYCHIATRIC: Negative for sleep disturbance and to question depression screen.  Negative for depression, negative for anhedonia.         Objective:  PHYSICAL EXAM:   VS: BP 126/86 (BP Location: Left Arm, Patient Position: Sitting, Cuff Size: Normal)   Pulse 90   Temp (!) 97.5 F (36.4 C) (Temporal)   Resp 18   Ht 5\' 1"  (1.549 m)   Wt 143 lb 6.4 oz (65 kg)   LMP  (LMP Unknown)   SpO2 99%   BMI 27.10 kg/m   GEN: Well nourished, well developed, in no acute distress   Cardiac: RRR; no murmurs, rubs, or gallops,no edema -  Respiratory:  normal respiratory rate and pattern with no distress - normal breath sounds with no  rales, rhonchi, wheezes or rubs  MS: no deformity or atrophy  Skin: warm and dry, no rash  Neuro:  Alert and Oriented x 3, - CN II-Xii grossly intact Psych: euthymic mood, appropriate affect and demeanor   There are no preventive care reminders to display for this patient.   There are no preventive care reminders to display for this patient.  Lab Results  Component Value Date   TSH 2.360 10/01/2022   Lab Results  Component Value Date   WBC 6.7 10/01/2022   HGB 14.6 10/01/2022   HCT 43.4 10/01/2022   MCV 98 (H) 10/01/2022   PLT 396 10/01/2022   Lab Results  Component Value Date   NA 141 10/01/2022   K 4.7 10/01/2022   CO2 25 10/01/2022   GLUCOSE 96 10/01/2022   BUN 7 (L) 10/01/2022   CREATININE 0.70 10/01/2022   BILITOT 0.4 10/01/2022   ALKPHOS 88 10/01/2022   AST 17 10/01/2022   ALT 10 10/01/2022   PROT 6.4 10/01/2022   ALBUMIN 4.3 10/01/2022   CALCIUM 9.4 10/01/2022   EGFR 92 10/01/2022   Lab Results  Component Value Date   CHOL 240 (H) 10/01/2022   Lab Results  Component Value Date   HDL 76 10/01/2022   Lab Results  Component Value Date   LDLCALC 147 (H) 10/01/2022   Lab Results  Component Value Date   TRIG 101 10/01/2022   Lab Results  Component Value Date   CHOLHDL 3.2 10/01/2022   Lab Results  Component  Value Date   HGBA1C 5.7 (H) 10/01/2022      Assessment & Plan:   Problem List Items Addressed This Visit       Cardiovascular and Mediastinum   Essential hypertension, benign - Primary   Relevant Medications   losartan (COZAAR) 50 MG tablet    Continue meds   Other Relevant Orders   CBC with Differential/Platelet   Comprehensive metabolic panel   Lipid panel     Musculoskeletal and Integument   Lumbar disc disease   Relevant Medications   naproxen (NAPROSYN) 500 MG tablet Follow up with specialist as directed   Primary osteoarthritis involving multiple joints   Relevant Medications   naproxen (NAPROSYN) 500 MG tablet     Other   Mixed hyperlipidemia   Relevant Medications   losartan (COZAAR) 50 MG tablet   Pt refuses statin Low fat/low chol diet   Other Relevant Orders   CBC with Differential/Platelet   Comprehensive metabolic panel   Lipid panel   Nicotine dependence   Relevant Orders   Recommend stop smoking   Vitamin D deficiency   Relevant Orders   VITAMIN D 25 Hydroxy (Vit-D Deficiency, Fractures)   Hyperglycemia   Relevant Orders   Hemoglobin A1c   Statin declined   Other Visit Diagnoses   Pulmonary nodules Pt REFUSES PET, PULMONOLOGY REFERRAL  EMPHYSEMA PT REFUSES ANY TREATMENT - NO INHALERS                 Meds ordered this encounter  Medications   losartan (COZAAR) 50 MG tablet    Sig: TAKE 1 TABLET(50 MG) BY MOUTH DAILY    Dispense:  90 tablet    Refill:  1    Supervising Provider:   COX, KIRSTEN [[id number]]   naproxen (NAPROSYN) 500 MG tablet    Sig: TAKE 1 TABLET(500 MG) BY MOUTH TWICE DAILY WITH A MEAL    Dispense:  180 tablet    Refill:  1  Supervising Provider:   Blane Ohara [phone]    Follow-up: Return in about 6 months (around 10/15/2023) for chronic fasting follow-up.    SARA R Annisa Mazzarella, PA-C

## 2023-04-15 LAB — CBC WITH DIFFERENTIAL/PLATELET
Basophils Absolute: 0.1 10*3/uL (ref 0.0–0.2)
Basos: 1 %
EOS (ABSOLUTE): 0.1 10*3/uL (ref 0.0–0.4)
Eos: 1 %
Hematocrit: 43.3 % (ref 34.0–46.6)
Hemoglobin: 14.3 g/dL (ref 11.1–15.9)
Immature Grans (Abs): 0 10*3/uL (ref 0.0–0.1)
Immature Granulocytes: 0 %
Lymphocytes Absolute: 1.8 10*3/uL (ref 0.7–3.1)
Lymphs: 28 %
MCH: 32.4 pg (ref 26.6–33.0)
MCHC: 33 g/dL (ref 31.5–35.7)
MCV: 98 fL — ABNORMAL HIGH (ref 79–97)
Monocytes Absolute: 0.3 10*3/uL (ref 0.1–0.9)
Monocytes: 5 %
Neutrophils Absolute: 4 10*3/uL (ref 1.4–7.0)
Neutrophils: 65 %
Platelets: 374 10*3/uL (ref 150–450)
RBC: 4.42 x10E6/uL (ref 3.77–5.28)
RDW: 13 % (ref 11.7–15.4)
WBC: 6.3 10*3/uL (ref 3.4–10.8)

## 2023-04-15 LAB — LIPID PANEL
Chol/HDL Ratio: 3.5 ratio (ref 0.0–4.4)
Cholesterol, Total: 222 mg/dL — ABNORMAL HIGH (ref 100–199)
HDL: 64 mg/dL (ref >39)
LDL Chol Calc (NIH): 133 mg/dL — ABNORMAL HIGH (ref 0–99)
Triglycerides: 140 mg/dL (ref 0–149)
VLDL Cholesterol Cal: 25 mg/dL (ref 5–40)

## 2023-04-15 LAB — COMPREHENSIVE METABOLIC PANEL
ALT: 9 IU/L (ref 0–32)
AST: 17 IU/L (ref 0–40)
Albumin: 4.1 g/dL (ref 3.8–4.8)
Alkaline Phosphatase: 94 IU/L (ref 44–121)
BUN/Creatinine Ratio: 11 — ABNORMAL LOW (ref 12–28)
BUN: 7 mg/dL — ABNORMAL LOW (ref 8–27)
Bilirubin Total: 0.4 mg/dL (ref 0.0–1.2)
CO2: 24 mmol/L (ref 20–29)
Calcium: 9.1 mg/dL (ref 8.7–10.3)
Chloride: 105 mmol/L (ref 96–106)
Creatinine, Ser: 0.62 mg/dL (ref 0.57–1.00)
Globulin, Total: 2.2 g/dL (ref 1.5–4.5)
Glucose: 84 mg/dL (ref 70–99)
Potassium: 4.6 mmol/L (ref 3.5–5.2)
Sodium: 143 mmol/L (ref 134–144)
Total Protein: 6.3 g/dL (ref 6.0–8.5)
eGFR: 94 mL/min/{1.73_m2} (ref >59)

## 2023-04-15 LAB — IRON,TIBC AND FERRITIN PANEL
Ferritin: 88 ng/mL (ref 15–150)
Iron Saturation: 34 % (ref 15–55)
Iron: 84 ug/dL (ref 27–139)
Total Iron Binding Capacity: 246 ug/dL — ABNORMAL LOW (ref 250–450)
UIBC: 162 ug/dL (ref 118–369)

## 2023-04-15 LAB — B12 AND FOLATE PANEL
Folate: >20 ng/mL (ref >3.0)
Vitamin B-12: 142 pg/mL — ABNORMAL LOW (ref 232–1245)

## 2023-04-15 LAB — VITAMIN D 25 HYDROXY (VIT D DEFICIENCY, FRACTURES): Vit D, 25-Hydroxy: 34 ng/mL (ref 30.0–100.0)

## 2023-04-15 LAB — HEMOGLOBIN A1C
Est. average glucose Bld gHb Est-mCnc: 120 mg/dL
Hgb A1c MFr Bld: 5.8 % — ABNORMAL HIGH (ref 4.8–5.6)

## 2023-04-20 ENCOUNTER — Other Ambulatory Visit: Payer: Self-pay | Admitting: Physician Assistant

## 2023-04-20 DIAGNOSIS — E538 Deficiency of other specified B group vitamins: Secondary | ICD-10-CM

## 2023-04-20 MED ORDER — CYANOCOBALAMIN 1000 MCG/ML IJ SOLN: INTRAMUSCULAR | 0 refills | Status: DC

## 2023-04-21 ENCOUNTER — Ambulatory Visit (INDEPENDENT_AMBULATORY_CARE_PROVIDER_SITE_OTHER)

## 2023-04-21 DIAGNOSIS — R899 Unspecified abnormal finding in specimens from other organs, systems and tissues: Secondary | ICD-10-CM

## 2023-04-21 LAB — POC HEMOCCULT BLD/STL (HOME/3-CARD/SCREEN)
Card #2 Fecal Occult Blod, POC: NEGATIVE
Card #3 Fecal Occult Blood, POC: NEGATIVE
Fecal Occult Blood, POC: NEGATIVE

## 2023-05-10 ENCOUNTER — Ambulatory Visit (INDEPENDENT_AMBULATORY_CARE_PROVIDER_SITE_OTHER)

## 2023-05-10 DIAGNOSIS — E538 Deficiency of other specified B group vitamins: Secondary | ICD-10-CM

## 2023-05-10 MED ORDER — CYANOCOBALAMIN 1000 MCG/ML IJ SOLN
1000.0000 ug | Freq: Once | INTRAMUSCULAR | Status: AC
Start: 2023-05-10 — End: 2023-05-10
  Administered 2023-05-10: 1000 ug via INTRAMUSCULAR

## 2023-05-10 NOTE — Progress Notes (Signed)
 Patient is in office today for a nurse visit for B12 Injection. Patient Injection was given in the  Left arm. Patient tolerated injection well.

## 2023-05-11 ENCOUNTER — Ambulatory Visit

## 2023-05-12 ENCOUNTER — Ambulatory Visit

## 2023-05-13 ENCOUNTER — Ambulatory Visit

## 2023-05-17 ENCOUNTER — Ambulatory Visit (INDEPENDENT_AMBULATORY_CARE_PROVIDER_SITE_OTHER)

## 2023-05-17 DIAGNOSIS — E559 Vitamin D deficiency, unspecified: Secondary | ICD-10-CM | POA: Diagnosis not present

## 2023-05-17 DIAGNOSIS — E538 Deficiency of other specified B group vitamins: Secondary | ICD-10-CM

## 2023-05-17 MED ORDER — CYANOCOBALAMIN 1000 MCG/ML IJ SOLN
1000.0000 ug | Freq: Once | INTRAMUSCULAR | Status: AC
  Administered 2023-05-17 (×2): 1000 ug via INTRAMUSCULAR

## 2023-05-17 NOTE — Progress Notes (Signed)
 Patient is in office today for a nurse visit for B12 Injection. Patient Injection was given in the  Left deltoid. Patient tolerated injection well.

## 2023-05-17 NOTE — Progress Notes (Deleted)
 Patient is in office today for a nurse visit for B12 Injection. Patient Injection was given in the  Left deltoid. Patient tolerated injection well.

## 2023-05-24 ENCOUNTER — Ambulatory Visit (INDEPENDENT_AMBULATORY_CARE_PROVIDER_SITE_OTHER)

## 2023-05-24 DIAGNOSIS — E538 Deficiency of other specified B group vitamins: Secondary | ICD-10-CM

## 2023-05-24 MED ORDER — CYANOCOBALAMIN 1000 MCG/ML IJ SOLN
1000.0000 ug | Freq: Once | INTRAMUSCULAR | Status: AC
  Administered 2023-05-24: 1000 ug via INTRAMUSCULAR

## 2023-05-24 NOTE — Progress Notes (Signed)
 Patient presents today for b12 injection. See MAR for additional information. Patient to follow up next week for next b12 injection.

## 2023-05-31 ENCOUNTER — Ambulatory Visit (INDEPENDENT_AMBULATORY_CARE_PROVIDER_SITE_OTHER)

## 2023-05-31 DIAGNOSIS — E538 Deficiency of other specified B group vitamins: Secondary | ICD-10-CM

## 2023-05-31 MED ORDER — CYANOCOBALAMIN 1000 MCG/ML IJ SOLN
1000.0000 ug | Freq: Once | INTRAMUSCULAR | Status: AC
  Administered 2023-05-31: 1000 ug via INTRAMUSCULAR

## 2023-05-31 NOTE — Progress Notes (Signed)
 Patient is in office today for a nurse visit for B12 Injection. Patient Injection was given in the  Left deltoid. Patient tolerated injection well.

## 2023-06-30 ENCOUNTER — Ambulatory Visit (INDEPENDENT_AMBULATORY_CARE_PROVIDER_SITE_OTHER)

## 2023-06-30 DIAGNOSIS — E538 Deficiency of other specified B group vitamins: Secondary | ICD-10-CM

## 2023-06-30 MED ORDER — CYANOCOBALAMIN 1000 MCG/ML IJ SOLN
1000.0000 ug | Freq: Once | INTRAMUSCULAR | Status: AC
  Administered 2023-06-30: 1000 ug via INTRAMUSCULAR

## 2023-06-30 NOTE — Progress Notes (Signed)
 Patient is in office today for a nurse visit for B12 Injection. Patient Injection was given in the  Left deltoid. Patient tolerated injection well.

## 2023-07-07 ENCOUNTER — Other Ambulatory Visit: Payer: Self-pay | Admitting: Physician Assistant

## 2023-07-07 DIAGNOSIS — M150 Primary generalized (osteo)arthritis: Secondary | ICD-10-CM

## 2023-07-07 DIAGNOSIS — M519 Unspecified thoracic, thoracolumbar and lumbosacral intervertebral disc disorder: Secondary | ICD-10-CM

## 2023-08-02 ENCOUNTER — Ambulatory Visit

## 2023-08-03 ENCOUNTER — Ambulatory Visit

## 2023-08-03 ENCOUNTER — Ambulatory Visit: Payer: Self-pay

## 2023-08-03 NOTE — Telephone Encounter (Signed)
 FYI Only or Action Required?: FYI only for provider.  Patient was last seen in primary care on 04/14/2023 by Nicholaus Credit, PA-C. Called Nurse Triage reporting Vomiting. Symptoms began last night. Interventions attempted: Nothing. Symptoms are: gradually worsening.  Triage Disposition: Go to ED Now (Notify PCP), Go to ED or PCP/Alternative with Approval  Patient/caregiver understands and will follow disposition?: Yes                   Copied from CRM [id number]. Topic: Clinical - Red Word Triage >> Aug 03, 2023 10:57 AM Avram MATSU wrote: Red Word that prompted transfer to Nurse Triage: throwing up/weakness Reason for Disposition  [1] SEVERE vomiting (e.g., 6 or more times/day) AND [2] present > 8 hours (Exception: Patient sounds well, is drinking liquids, does not sound dehydrated, and vomiting has lasted less than 24 hours.)    6th episode of active vomiting while on the phone with this RN.  [1] Vomiting AND [2] contains red blood or black (coffee ground) material  (Exception: Few red streaks in vomit that only happened once.)    Patient states she thought she had seen what looked like coffee grounds in her vomit  Answer Assessment - Initial Assessment Questions 1. VOMITING SEVERITY: How many times have you vomited in the past 24 hours?     - MILD:  1 - 2 times/day    - MODERATE: 3 - 5 times/day, decreased oral intake without significant weight loss or symptoms of dehydration    - SEVERE: 6 or more times/day, vomits everything or nearly everything, with significant weight loss, symptoms of dehydration      5 2. ONSET: When did the vomiting begin?      Last night around 7pm 3. FLUIDS: What fluids or food have you vomited up today? Have you been able to keep any fluids down?     Some fluids 4. ABDOMEN PAIN: Are your having any abdomen pain? If Yes : How bad is it and what does it feel like? (e.g., crampy, dull, intermittent, constant)      No 5. DIARRHEA: Is  there any diarrhea? If Yes, ask: How many times today?      Two episodes last week 6. CONTACTS: Is there anyone else in the family with the same symptoms?      ----- 7. CAUSE: What do you think is causing your vomiting?     unknown 8. HYDRATION STATUS: Any signs of dehydration? (e.g., dry mouth [not only dry lips], too weak to stand) When did you last urinate?     ----- 9. OTHER SYMPTOMS: Do you have any other symptoms? (e.g., fever, headache, vertigo, vomiting blood or coffee grounds, recent head injury)     Dizzy, thought she saw some black specks in vomit per patient    Nausea during the day yesterday Last night around 7pm patient vomited Denies abdominal pain at this present time Chilled when she went to bed and then throughout the night got hot/sweaty  5 episodes---6th episode during triage with this RN.  Daughter is advised that with these symptoms it is recommended that the patient goes to the Emergency Room at this time for further evaluation/immediate medical attention. Daughter is advised to call us  back if they have any questions of if her mother doesn't want to go to the Emergency Room. The daughter states they don't need an ambulance at this time. Daughter is advised that if any point on the way to the Emergency Room if  things get worse to pull over and call 911. Daughter verbalized understanding.  Protocols used: Vomiting-A-AH

## 2023-08-03 NOTE — Telephone Encounter (Signed)
 Attempted to call [facility] to advice them of patient's disposition and symptoms. No answer at [facility].

## 2023-08-09 ENCOUNTER — Ambulatory Visit

## 2023-08-09 DIAGNOSIS — E538 Deficiency of other specified B group vitamins: Secondary | ICD-10-CM

## 2023-08-09 MED ORDER — CYANOCOBALAMIN 1000 MCG/ML IJ SOLN
1000.0000 ug | Freq: Once | INTRAMUSCULAR | Status: AC
  Administered 2023-08-09: 1000 ug via INTRAMUSCULAR

## 2023-08-09 NOTE — Progress Notes (Signed)
 Patient is in office today for a nurse visit for B12 Injection. Patient Injection was given in the  Left deltoid. Patient tolerated injection well.

## 2023-09-09 ENCOUNTER — Ambulatory Visit (INDEPENDENT_AMBULATORY_CARE_PROVIDER_SITE_OTHER)

## 2023-09-09 DIAGNOSIS — E538 Deficiency of other specified B group vitamins: Secondary | ICD-10-CM

## 2023-09-09 MED ORDER — CYANOCOBALAMIN 1000 MCG/ML IJ SOLN
1000.0000 ug | Freq: Once | INTRAMUSCULAR | Status: AC
Start: 2023-09-09 — End: 2023-09-09
  Administered 2023-09-09: 1000 ug via INTRAMUSCULAR

## 2023-09-09 NOTE — Progress Notes (Signed)
 Patient is in office today for a nurse visit for B12 Injection. Patient Injection was given in the  Right deltoid. Patient tolerated injection well.

## 2023-10-11 ENCOUNTER — Ambulatory Visit (INDEPENDENT_AMBULATORY_CARE_PROVIDER_SITE_OTHER)

## 2023-10-11 DIAGNOSIS — E538 Deficiency of other specified B group vitamins: Secondary | ICD-10-CM

## 2023-10-11 MED ORDER — CYANOCOBALAMIN 1000 MCG/ML IJ SOLN: 1000.0000 ug | Freq: Once | INTRAMUSCULAR | 0 refills | Status: DC

## 2023-10-11 MED ORDER — CYANOCOBALAMIN 1000 MCG/ML IJ SOLN
1000.0000 ug | Freq: Once | INTRAMUSCULAR | Status: AC
  Administered 2023-10-11: 1000 ug via INTRAMUSCULAR

## 2023-10-11 NOTE — Progress Notes (Signed)
 Patient presented for b12 injection. Injection administered IM right deltoid.

## 2023-10-18 ENCOUNTER — Encounter: Payer: Self-pay | Admitting: Physician Assistant

## 2023-10-18 ENCOUNTER — Ambulatory Visit (INDEPENDENT_AMBULATORY_CARE_PROVIDER_SITE_OTHER): Admitting: Physician Assistant

## 2023-10-18 VITALS — BP 130/82 | HR 87 | Temp 98.2°F | Resp 18 | Ht 61.0 in | Wt 141.2 lb

## 2023-10-18 DIAGNOSIS — Z1231 Encounter for screening mammogram for malignant neoplasm of breast: Secondary | ICD-10-CM

## 2023-10-18 DIAGNOSIS — E559 Vitamin D deficiency, unspecified: Secondary | ICD-10-CM | POA: Diagnosis not present

## 2023-10-18 DIAGNOSIS — E538 Deficiency of other specified B group vitamins: Secondary | ICD-10-CM

## 2023-10-18 DIAGNOSIS — I10 Essential (primary) hypertension: Secondary | ICD-10-CM

## 2023-10-18 DIAGNOSIS — E782 Mixed hyperlipidemia: Secondary | ICD-10-CM | POA: Diagnosis not present

## 2023-10-18 DIAGNOSIS — M519 Unspecified thoracic, thoracolumbar and lumbosacral intervertebral disc disorder: Secondary | ICD-10-CM

## 2023-10-18 DIAGNOSIS — N959 Unspecified menopausal and perimenopausal disorder: Secondary | ICD-10-CM

## 2023-10-18 DIAGNOSIS — Z23 Encounter for immunization: Secondary | ICD-10-CM | POA: Diagnosis not present

## 2023-10-18 DIAGNOSIS — Z5320 Procedure and treatment not carried out because of patient's decision for unspecified reasons: Secondary | ICD-10-CM

## 2023-10-18 DIAGNOSIS — J438 Other emphysema: Secondary | ICD-10-CM

## 2023-10-18 DIAGNOSIS — R918 Other nonspecific abnormal finding of lung field: Secondary | ICD-10-CM

## 2023-10-18 DIAGNOSIS — R7303 Prediabetes: Secondary | ICD-10-CM | POA: Diagnosis not present

## 2023-10-18 NOTE — Progress Notes (Signed)
 Established Patient Office Visit  Subjective:  Patient ID: Stephanie Mcdowell, female    DOB: 1951/01/18  Age: 73 y.o. MRN: [id number]  CC:  Chief Complaint  Patient presents with   Medical Management of Chronic Issues    HPI Stephanie Mcdowell presents for chronic follow up  Pt presents for follow up of hypertension. The patient is tolerating the medication well without side effects. Compliance with treatment has been good; including taking medication as directed , maintains a healthy diet and regular exercise regimen , and following up as directed. Currently taking losartan  50mg  qd She denies chest pain or shortness of breath  Pt with  chronic low back pain and osteoarthritis - uses naprosyn  qd-   Pt taking otc vit D - due to check labwork  Pt with vit B12 deficiency - takes monthly B12 injection  Pt with prediabetes - watching diet Due to check labwork  Pt with  hyperlipidemia.  She refuses statin treatment - states it caused her legs to ache.  She has tried statins and zetia .  Declines injectable therapy Says she will try to watch her diet  Pt is a smoker of 1 ppd.  She has been smoking for more than 50 years.  She states that she refuses inhalers and will not try any - she states inhalers caused her husband to get throat cancer and she refuses treatment.  States her 'breathing is fine' She did have CT lung screening in 2/24 which showed emphysema and also irregular nodules -- was advised to get PET scan but she refused  I did discuss these findings could be consistent with lung cancer and also recommended pulmonology referral which she refused - at last visit her daugher accompanied her and discussion was made with both of them.  Pt still refuses any inhalers, refuses PET scan, refuses pulmonology referral and refuses repeat lung CT scan  Pt would like flu shot today Pt would like to schedule mammogram and dexa scan today Past Medical History:  Diagnosis Date   Essential  hypertension, benign 11/23/2019   Lumbar disc disease 09/21/2017   Formatting of this note might be different from the original. Taking shots in back.   Mild episode of recurrent major depressive disorder (HCC) 09/21/2017   Mixed hyperlipidemia 07/30/2015   Nicotine dependence 11/23/2019   Primary osteoarthritis involving multiple joints 07/23/2015    Past Surgical History:  Procedure Laterality Date   APPENDECTOMY     CHOLECYSTECTOMY  2020   TOTAL HIP ARTHROPLASTY Left 09/16/2020   TUBAL LIGATION      Family History  Adopted: Yes  Problem Relation Age of Onset   Breast cancer Neg Hx     Social History   Socioeconomic History   Marital status: Widowed    Spouse name: Not on file   Number of children: 1   Years of education: Not on file   Highest education level: Not on file  Occupational History   Occupation: Retired Social worker  Tobacco Use   Smoking status: Every Day    Current packs/day: 0.50    Average packs/day: 0.5 packs/day for 15.0 years (7.5 ttl pk-yrs)    Types: Cigarettes   Smokeless tobacco: Never  Vaping Use   Vaping status: Never Used  Substance and Sexual Activity   Alcohol use: Not Currently   Drug use: Never   Sexual activity: Not Currently  Other Topics Concern   Not on file  Social History Narrative   Not on file  Social Drivers of Corporate investment banker Strain: Low Risk  (10/01/2022)   Overall Financial Resource Strain (CARDIA)    Difficulty of Paying Living Expenses: Not hard at all  Food Insecurity: No Food Insecurity (10/01/2022)   Hunger Vital Sign    Worried About Running Out of Food in the Last Year: Never true    Ran Out of Food in the Last Year: Never true  Transportation Needs: No Transportation Needs (10/01/2022)   PRAPARE - Administrator, Civil Service (Medical): No    Lack of Transportation (Non-Medical): No  Physical Activity: Inactive (10/01/2022)   Exercise Vital Sign    Days of Exercise per Week: 0 days     Minutes of Exercise per Session: 0 min  Stress: No Stress Concern Present (10/01/2022)   [employer] of Occupational Health - Occupational Stress Questionnaire    Feeling of Stress : Not at all  Social Connections: Unknown (10/01/2022)   Social Connection and Isolation Panel    Frequency of Communication with Friends and Family: More than three times a week    Frequency of Social Gatherings with Friends and Family: Three times a week    Attends Religious Services: Patient declined    Active Member of Clubs or Organizations: No    Attends Banker Meetings: Never    Marital Status: Married  Catering manager Violence: Not At Risk (10/01/2022)   Humiliation, Afraid, Rape, and Kick questionnaire    Fear of Current or Ex-Partner: No    Emotionally Abused: No    Physically Abused: No    Sexually Abused: No     Current Outpatient Medications:    Cholecalciferol (VITAMIN D -3) 125 MCG (5000 UT) TABS, Take by mouth., Disp: , Rfl:    cyanocobalamin  (VITAMIN B12) 1000 MCG/ML injection, INJECT 1 ML ONCE WEEKLY AS DIRECTED FOR 4 WEEKS THEN 1 ML ONCE MONTHLY, Disp: 12 mL, Rfl: 0   losartan  (COZAAR ) 50 MG tablet, TAKE 1 TABLET(50 MG) BY MOUTH DAILY, Disp: 90 tablet, Rfl: 1   naproxen  (NAPROSYN ) 500 MG tablet, TAKE 1 TABLET(500 MG) BY MOUTH TWICE DAILY WITH A MEAL, Disp: 180 tablet, Rfl: 1   B Complex-C (VITAMIN B + C COMPLEX PO), Take by mouth., Disp: , Rfl:    Allergies  Allergen Reactions   Alendronate Nausea And Vomiting    CONSTITUTIONAL: Negative for chills, fatigue, fever, unintentional weight gain and unintentional weight loss.  E/N/T: Negative for ear pain, nasal congestion and sore throat.  CARDIOVASCULAR: Negative for chest pain, dizziness, palpitations and pedal edema.  RESPIRATORY: see HPI GASTROINTESTINAL: Negative for abdominal pain, acid reflux symptoms, constipation, diarrhea, nausea and vomiting.  MSK: Negative for arthralgias and myalgias.  INTEGUMENTARY:  Negative for rash.  NEUROLOGICAL: Negative for dizziness and headaches.  PSYCHIATRIC: Negative for sleep disturbance and to question depression screen.  Negative for depression, negative for anhedonia.         Objective:  PHYSICAL EXAM:   VS: BP 130/82   Pulse 87   Temp 98.2 F (36.8 C) (Temporal)   Resp 18   Ht 5' 1 (1.549 m)   Wt 141 lb 3.2 oz (64 kg)   LMP  (LMP Unknown)   SpO2 95%   BMI 26.68 kg/m   GEN: Well nourished, well developed, in no acute distress  Cardiac: RRR; no murmurs, rubs, or gallops,no edema  Respiratory:  normal respiratory rate and pattern with no distress - scattered mild expiratory wheezes throughout lung fields MS: no  deformity or atrophy  Skin: warm and dry, no rash  Neuro:  Alert and Oriented x 3, - CN II-Xii grossly intact Psych: euthymic mood, appropriate affect and demeanor   Health Maintenance Due  Topic Date Due   Mammogram  11/11/2023   Colonoscopy  11/17/2023     There are no preventive care reminders to display for this patient.  Lab Results  Component Value Date   TSH 2.360 10/01/2022   Lab Results  Component Value Date   WBC 6.3 04/14/2023   HGB 14.3 04/14/2023   HCT 43.3 04/14/2023   MCV 98 (H) 04/14/2023   PLT 374 04/14/2023   Lab Results  Component Value Date   NA 143 04/14/2023   K 4.6 04/14/2023   CO2 24 04/14/2023   GLUCOSE 84 04/14/2023   BUN 7 (L) 04/14/2023   CREATININE 0.62 04/14/2023   BILITOT 0.4 04/14/2023   ALKPHOS 94 04/14/2023   AST 17 04/14/2023   ALT 9 04/14/2023   PROT 6.3 04/14/2023   ALBUMIN 4.1 04/14/2023   CALCIUM  9.1 04/14/2023   EGFR 94 04/14/2023   Lab Results  Component Value Date   CHOL 222 (H) 04/14/2023   Lab Results  Component Value Date   HDL 64 04/14/2023   Lab Results  Component Value Date   LDLCALC 133 (H) 04/14/2023   Lab Results  Component Value Date   TRIG 140 04/14/2023   Lab Results  Component Value Date   CHOLHDL 3.5 04/14/2023   Lab Results   Component Value Date   HGBA1C 5.8 (H) 04/14/2023      Assessment & Plan:   Problem List Items Addressed This Visit       Cardiovascular and Mediastinum   Essential hypertension, benign - Primary   Relevant Medications   losartan  (COZAAR ) 50 MG tablet    Continue meds   Other Relevant Orders   CBC with Differential/Platelet   Comprehensive metabolic panel   Lipid panel     Musculoskeletal and Integument   Lumbar disc disease   Relevant Medications   naproxen  (NAPROSYN ) 500 MG tablet Follow up with specialist as directed              Other   Mixed hyperlipidemia   Relevant Medications   losartan  (COZAAR ) 50 MG tablet   Pt refuses statin Low fat/low chol diet   Other Relevant Orders   CBC with Differential/Platelet   Comprehensive metabolic panel   Lipid panel      B12 deficiency Continue B12 injections      Vitamin D  deficiency   Relevant Orders   VITAMIN D  25 Hydroxy (Vit-D Deficiency, Fractures)   Prediabetes   Relevant Orders   Hemoglobin A1c   Statin declined   Other Visit Diagnoses   Pulmonary nodules Pt REFUSES PET, PULMONOLOGY REFERRAL  EMPHYSEMA PT REFUSES ANY TREATMENT - NO INHALERS    Need flu vaccine Flublok Hi dose given  Breast cancer screening Mammogram ordered  Postmenopausal and menopausal disorder Dexa scan ordered             No orders of the defined types were placed in this encounter.   Follow-up: Return in about 6 months (around 04/16/2024) for chronic fasting follow-up.    SARA R Loris Winrow, PA-C

## 2023-10-19 ENCOUNTER — Ambulatory Visit: Payer: Self-pay | Admitting: Physician Assistant

## 2023-10-19 LAB — COMPREHENSIVE METABOLIC PANEL WITH GFR
ALT: 8 IU/L (ref 0–32)
AST: 16 IU/L (ref 0–40)
Albumin: 4.4 g/dL (ref 3.8–4.8)
Alkaline Phosphatase: 91 IU/L (ref 49–135)
BUN/Creatinine Ratio: 10 — ABNORMAL LOW (ref 12–28)
BUN: 7 mg/dL — ABNORMAL LOW (ref 8–27)
Bilirubin Total: 0.3 mg/dL (ref 0.0–1.2)
CO2: 23 mmol/L (ref 20–29)
Calcium: 9.3 mg/dL (ref 8.7–10.3)
Chloride: 101 mmol/L (ref 96–106)
Creatinine, Ser: 0.67 mg/dL (ref 0.57–1.00)
Globulin, Total: 2 g/dL (ref 1.5–4.5)
Glucose: 77 mg/dL (ref 70–99)
Potassium: 4.7 mmol/L (ref 3.5–5.2)
Sodium: 137 mmol/L (ref 134–144)
Total Protein: 6.4 g/dL (ref 6.0–8.5)
eGFR: 92 mL/min/1.73 (ref >59)

## 2023-10-19 LAB — LIPID PANEL
Chol/HDL Ratio: 3.7 ratio (ref 0.0–4.4)
Cholesterol, Total: 241 mg/dL — ABNORMAL HIGH (ref 100–199)
HDL: 66 mg/dL (ref >39)
LDL Chol Calc (NIH): 156 mg/dL — ABNORMAL HIGH (ref 0–99)
Triglycerides: 109 mg/dL (ref 0–149)
VLDL Cholesterol Cal: 19 mg/dL (ref 5–40)

## 2023-10-19 LAB — CBC WITH DIFFERENTIAL/PLATELET
Basophils Absolute: 0.1 x10E3/uL (ref 0.0–0.2)
Basos: 2 %
EOS (ABSOLUTE): 0.1 x10E3/uL (ref 0.0–0.4)
Eos: 2 %
Hematocrit: 44.7 % (ref 34.0–46.6)
Hemoglobin: 14.6 g/dL (ref 11.1–15.9)
Immature Grans (Abs): 0 x10E3/uL (ref 0.0–0.1)
Immature Granulocytes: 0 %
Lymphocytes Absolute: 2.1 x10E3/uL (ref 0.7–3.1)
Lymphs: 33 %
MCH: 32.7 pg (ref 26.6–33.0)
MCHC: 32.7 g/dL (ref 31.5–35.7)
MCV: 100 fL — ABNORMAL HIGH (ref 79–97)
Monocytes Absolute: 0.6 x10E3/uL (ref 0.1–0.9)
Monocytes: 9 %
Neutrophils Absolute: 3.6 x10E3/uL (ref 1.4–7.0)
Neutrophils: 54 %
Platelets: 376 x10E3/uL (ref 150–450)
RBC: 4.47 x10E6/uL (ref 3.77–5.28)
RDW: 13.4 % (ref 11.7–15.4)
WBC: 6.6 x10E3/uL (ref 3.4–10.8)

## 2023-10-19 LAB — B12 AND FOLATE PANEL
Folate: 8.3 ng/mL (ref >3.0)
Vitamin B-12: 622 pg/mL (ref 232–1245)

## 2023-10-19 LAB — HEMOGLOBIN A1C
Est. average glucose Bld gHb Est-mCnc: 117 mg/dL
Hgb A1c MFr Bld: 5.7 % — ABNORMAL HIGH (ref 4.8–5.6)

## 2023-10-19 LAB — VITAMIN D 25 HYDROXY (VIT D DEFICIENCY, FRACTURES): Vit D, 25-Hydroxy: 38.1 ng/mL (ref 30.0–100.0)

## 2023-10-19 LAB — TSH: TSH: 2.63 u[IU]/mL (ref 0.450–4.500)

## 2023-10-20 ENCOUNTER — Ambulatory Visit

## 2023-10-21 ENCOUNTER — Ambulatory Visit (INDEPENDENT_AMBULATORY_CARE_PROVIDER_SITE_OTHER)

## 2023-10-21 VITALS — Ht 61.0 in | Wt 141.0 lb

## 2023-10-21 DIAGNOSIS — Z0000 Encounter for general adult medical examination without abnormal findings: Secondary | ICD-10-CM | POA: Diagnosis not present

## 2023-10-21 NOTE — Patient Instructions (Signed)
 [name],  Thank you for taking the time for your Medicare Wellness Visit. I appreciate your continued commitment to your health goals. Please review the care plan we discussed, and feel free to reach out if I can assist you further.  Medicare recommends these wellness visits once per year to help you and your care team stay ahead of potential health issues. These visits are designed to focus on prevention, allowing your provider to concentrate on managing your acute and chronic conditions during your regular appointments.  Please note that Annual Wellness Visits do not include a physical exam. Some assessments may be limited, especially if the visit was conducted virtually. If needed, we may recommend a separate in-person follow-up with your provider.  Ongoing Care Seeing your primary care provider every 3 to 6 months helps us  monitor your health and provide consistent, personalized care.   Referrals If a referral was made during today's visit and you haven't received any updates within two weeks, please contact the referred provider directly to check on the status.  Recommended Screenings:  Health Maintenance  Topic Date Due   Breast Cancer Screening  11/11/2023   Colon Cancer Screening  11/17/2023   DTaP/Tdap/Td vaccine (2 - Td or Tdap) 04/13/2024*   DEXA scan (bone density measurement)  04/13/2024*   Medicare Annual Wellness Visit  10/20/2024   Pneumococcal Vaccine for age over 46  Completed   Flu Shot  Completed   HPV Vaccine  Aged Out   Meningitis B Vaccine  Aged Out   COVID-19 Vaccine  Discontinued   Hepatitis C Screening  Discontinued   Zoster (Shingles) Vaccine  Discontinued  *Topic was postponed. The date shown is not the original due date.       10/21/2023    4:17 PM  Advanced Directives  Does Patient Have a Medical Advance Directive? No  Would patient like information on creating a medical advance directive? Yes (MAU/Ambulatory/Procedural Areas - Information given)    Advance Care Planning is important because it: Ensures you receive medical care that aligns with your values, goals, and preferences. Provides guidance to your family and loved ones, reducing the emotional burden of decision-making during critical moments.  Vision: Annual vision screenings are recommended for early detection of glaucoma, cataracts, and diabetic retinopathy. These exams can also reveal signs of chronic conditions such as diabetes and high blood pressure.  Dental: Annual dental screenings help detect early signs of oral cancer, gum disease, and other conditions linked to overall health, including heart disease and diabetes.  Please see the attached documents for additional preventive care recommendations.

## 2023-10-21 NOTE — Progress Notes (Signed)
 Subjective:   Stephanie Mcdowell is a 73 y.o. who presents for a Medicare Wellness preventive visit.  As a reminder, Annual Wellness Visits don't include a physical exam, and some assessments may be limited, especially if this visit is performed virtually. We may recommend an in-person follow-up visit with your provider if needed.  Visit Complete: Virtual I connected with  Stephanie Mcdowell on 10/21/23 by a audio enabled telemedicine application and verified that I am speaking with the correct person using two identifiers.  Patient Location: Home  Provider Location: Home Office  I discussed the limitations of evaluation and management by telemedicine. The patient expressed understanding and agreed to proceed.  Vital Signs: Because this visit was a virtual/telehealth visit, some criteria may be missing or patient reported. Any vitals not documented were not able to be obtained and vitals that have been documented are patient reported.  VideoDeclined- This patient declined Librarian, academic. Therefore the visit was completed with audio only.  Persons Participating in Visit: Patient.  AWV Questionnaire: No: Patient Medicare AWV questionnaire was not completed prior to this visit.  Cardiac Risk Factors include: advanced age (>48men, >1 women);smoking/ tobacco exposure;hypertension     Objective:    Today's Vitals   10/21/23 1526  Weight: 141 lb (64 kg)  Height: 5' 1 (1.549 m)   Body mass index is 26.64 kg/m.     10/21/2023    4:17 PM  Advanced Directives  Does Patient Have a Medical Advance Directive? No  Would patient like information on creating a medical advance directive? Yes (MAU/Ambulatory/Procedural Areas - Information given)    Current Medications (verified) Outpatient Encounter Medications as of 10/21/2023  Medication Sig   Cholecalciferol (VITAMIN D -3) 125 MCG (5000 UT) TABS Take by mouth.   cyanocobalamin  (VITAMIN B12) 1000 MCG/ML injection  INJECT 1 ML ONCE WEEKLY AS DIRECTED FOR 4 WEEKS THEN 1 ML ONCE MONTHLY   losartan  (COZAAR ) 50 MG tablet TAKE 1 TABLET(50 MG) BY MOUTH DAILY   naproxen  (NAPROSYN ) 500 MG tablet TAKE 1 TABLET(500 MG) BY MOUTH TWICE DAILY WITH A MEAL   No facility-administered encounter medications on file as of 10/21/2023.    Allergies (verified) Alendronate   History: Past Medical History:  Diagnosis Date   Essential hypertension, benign 11/23/2019   Lumbar disc disease 09/21/2017   Formatting of this note might be different from the original. Taking shots in back.   Mild episode of recurrent major depressive disorder (HCC) 09/21/2017   Mixed hyperlipidemia 07/30/2015   Nicotine dependence 11/23/2019   Primary osteoarthritis involving multiple joints 07/23/2015   Past Surgical History:  Procedure Laterality Date   APPENDECTOMY     CHOLECYSTECTOMY  2020   TOTAL HIP ARTHROPLASTY Left 09/16/2020   TUBAL LIGATION     Family History  Adopted: Yes  Problem Relation Age of Onset   Breast cancer Neg Hx    Social History   Socioeconomic History   Marital status: Widowed    Spouse name: Not on file   Number of children: 1   Years of education: Not on file   Highest education level: Not on file  Occupational History   Occupation: Retired Social worker  Tobacco Use   Smoking status: Every Day    Current packs/day: 0.50    Average packs/day: 0.5 packs/day for 15.0 years (7.5 ttl pk-yrs)    Types: Cigarettes   Smokeless tobacco: Never  Vaping Use   Vaping status: Never Used  Substance and Sexual Activity  Alcohol use: Not Currently   Drug use: Never   Sexual activity: Not Currently  Other Topics Concern   Not on file  Social History Narrative   Not on file   Social Drivers of Health   Financial Resource Strain: Low Risk  (10/21/2023)   Overall Financial Resource Strain (CARDIA)    Difficulty of Paying Living Expenses: Not hard at all  Food Insecurity: No Food Insecurity (10/21/2023)    Hunger Vital Sign    Worried About Running Out of Food in the Last Year: Never true    Ran Out of Food in the Last Year: Never true  Transportation Needs: No Transportation Needs (10/21/2023)   PRAPARE - Administrator, Civil Service (Medical): No    Lack of Transportation (Non-Medical): No  Physical Activity: Inactive (10/21/2023)   Exercise Vital Sign    Days of Exercise per Week: 0 days    Minutes of Exercise per Session: 0 min  Stress: No Stress Concern Present (10/21/2023)   [employer] of Occupational Health - Occupational Stress Questionnaire    Feeling of Stress: Not at all  Social Connections: Moderately Isolated (10/21/2023)   Social Connection and Isolation Panel    Frequency of Communication with Friends and Family: More than three times a week    Frequency of Social Gatherings with Friends and Family: Three times a week    Attends Religious Services: Patient declined    Active Member of Clubs or Organizations: No    Attends Banker Meetings: Never    Marital Status: Married    Tobacco Counseling Ready to quit: Not Answered Counseling given: Not Answered    Clinical Intake:  Pre-visit preparation completed: Yes  Pain : No/denies pain  Diabetes: No  Lab Results  Component Value Date   HGBA1C 5.7 (H) 10/18/2023   HGBA1C 5.8 (H) 04/14/2023   HGBA1C 5.7 (H) 10/01/2022     How often do you need to have someone help you when you read instructions, pamphlets, or other written materials from your doctor or pharmacy?: 1 - Never  Interpreter Needed?: No  Information entered by :: Charmaine Bloodgood LPN   Activities of Daily Living     10/21/2023    4:17 PM  In your present state of health, do you have any difficulty performing the following activities:  Hearing? 0  Vision? 0  Difficulty concentrating or making decisions? 0  Walking or climbing stairs? 0  Dressing or bathing? 0  Doing errands, shopping? 0  Preparing Food and eating  ? N  Using the Toilet? N  In the past six months, have you accidently leaked urine? N  Do you have problems with loss of bowel control? N  Managing your Medications? N  Managing your Finances? N  Housekeeping or managing your Housekeeping? N    Patient Care Team: Nicholaus Credit, PA-C as PCP - General (Physician Assistant) Renato Romberg, MD as Consulting Physician (Orthopedic Surgery)  I have updated your Care Teams any recent Medical Services you may have received from other providers in the past year.     Assessment:   This is a routine wellness examination for Stephanie Mcdowell.  Hearing/Vision screen Hearing Screening - Comments:: Denies hearing difficulties   Vision Screening - Comments:: Wears rx glasses - up to date with routine eye exams    Goals Addressed             This Visit's Progress    Maintain health and independence  On track      Depression Screen     10/21/2023    3:56 PM 10/18/2023   11:01 AM 04/14/2023   10:57 AM 10/13/2022    3:29 PM 10/01/2022   11:05 AM 03/16/2022    8:54 AM 09/10/2021   10:05 AM  PHQ 2/9 Scores  PHQ - 2 Score 5 5 0 0 0 0 0  PHQ- 9 Score 5 5  0 0 0 0    Fall Risk     10/21/2023    4:17 PM 10/18/2023   11:01 AM 04/14/2023   10:57 AM 10/13/2022    3:28 PM 10/01/2022   11:05 AM  Fall Risk   Falls in the past year? 0 0 0 0 0  Number falls in past yr: 0 0 0 0 0  Injury with Fall? 0 0 0 0 0  Risk for fall due to : No Fall Risks No Fall Risks No Fall Risks No Fall Risks No Fall Risks  Follow up Falls prevention discussed;Education provided;Falls evaluation completed Falls evaluation completed Falls evaluation completed Falls evaluation completed;Education provided Falls evaluation completed    MEDICARE RISK AT HOME:  Medicare Risk at Home Any stairs in or around the home?: No If so, are there any without handrails?: No Home free of loose throw rugs in walkways, pet beds, electrical cords, etc?: Yes Adequate lighting in your home to reduce  risk of falls?: Yes Life alert?: No Use of a cane, walker or w/c?: No Grab bars in the bathroom?: Yes Shower chair or bench in shower?: No Elevated toilet seat or a handicapped toilet?: Yes  TIMED UP AND GO:  Was the test performed?  No  Cognitive Function: 6CIT completed        10/21/2023    4:17 PM 10/13/2022    3:29 PM 04/23/2021    4:04 PM  6CIT Screen  What Year? 0 points 0 points 0 points  What month? 0 points 0 points 0 points  What time? 0 points 0 points 0 points  Count back from 20 0 points 0 points 0 points  Months in reverse 0 points 0 points 0 points  Repeat phrase 0 points 0 points 0 points  Total Score 0 points 0 points 0 points    Immunizations Immunization History  Administered Date(s) Administered   Fluad Quad(high Dose 65+) 11/23/2019   INFLUENZA, HIGH DOSE SEASONAL PF 10/18/2023   Influenza,inj,Quad PF,6+ Mos 04/05/2018, 10/31/2018   Influenza-Unspecified 11/14/2020   Pneumococcal Conjugate-13 07/30/2015   Pneumococcal Polysaccharide-23 09/07/2016   Tdap 02/03/2004    Screening Tests Health Maintenance  Topic Date Due   Mammogram  11/11/2023   Colonoscopy  11/17/2023   DTaP/Tdap/Td (2 - Td or Tdap) 04/13/2024 (Originally 02/02/2014)   DEXA SCAN  04/13/2024 (Originally 03/19/2023)   Medicare Annual Wellness (AWV)  10/20/2024   Pneumococcal Vaccine: 50+ Years  Completed   Influenza Vaccine  Completed   HPV VACCINES  Aged Out   Meningococcal B Vaccine  Aged Out   COVID-19 Vaccine  Discontinued   Hepatitis C Screening  Discontinued   Zoster Vaccines- Shingrix  Discontinued     Additional Screening:  Vision Screening: Recommended annual ophthalmology exams for early detection of glaucoma and other disorders of the eye. Is the patient up to date with their annual eye exam?  Yes  Who is the provider or what is the name of the office in which the patient attends annual eye exams?   Dental Screening: Recommended  annual dental exams for proper oral  hygiene  Community Resource Referral / Chronic Care Management: CRR required this visit?  No   CCM required this visit?  No   Plan:    I have personally reviewed and noted the following in the patient's chart:   Medical and social history Use of alcohol, tobacco or illicit drugs  Current medications and supplements including opioid prescriptions. Patient is not currently taking opioid prescriptions. Functional ability and status Nutritional status Physical activity Advanced directives List of other physicians Hospitalizations, surgeries, and ER visits in previous 12 months Vitals Screenings to include cognitive, depression, and falls Referrals and appointments  In addition, I have reviewed and discussed with patient certain preventive protocols, quality metrics, and best practice recommendations. A written personalized care plan for preventive services as well as general preventive health recommendations were provided to patient.   Stephanie Mcdowell, [place]   [id number]   After Visit Summary: (MyChart) Due to this being a telephonic visit, the after visit summary with patients personalized plan was offered to patient via MyChart   Notes: Nothing significant to report at this time.

## 2023-11-10 ENCOUNTER — Ambulatory Visit (INDEPENDENT_AMBULATORY_CARE_PROVIDER_SITE_OTHER)

## 2023-11-10 DIAGNOSIS — E538 Deficiency of other specified B group vitamins: Secondary | ICD-10-CM | POA: Diagnosis not present

## 2023-11-10 MED ORDER — CYANOCOBALAMIN 1000 MCG/ML IJ SOLN
1000.0000 ug | Freq: Once | INTRAMUSCULAR | Status: AC
  Administered 2023-11-10: 1000 ug via INTRAMUSCULAR

## 2023-11-10 NOTE — Progress Notes (Signed)
 Patient presented for b12 injection. Administered IM left upper outer buttocks. See MAR for additional info.

## 2023-11-12 ENCOUNTER — Encounter (HOSPITAL_BASED_OUTPATIENT_CLINIC_OR_DEPARTMENT_OTHER): Payer: Self-pay | Admitting: Radiology

## 2023-11-12 ENCOUNTER — Ambulatory Visit (HOSPITAL_BASED_OUTPATIENT_CLINIC_OR_DEPARTMENT_OTHER)
Admission: RE | Admit: 2023-11-12 | Discharge: 2023-11-12 | Disposition: A | Discharge status: Home / Self Care | Source: Ambulatory Visit | Attending: Physician Assistant | Admitting: Physician Assistant

## 2023-11-12 ENCOUNTER — Ambulatory Visit (INDEPENDENT_AMBULATORY_CARE_PROVIDER_SITE_OTHER)
Admission: RE | Admit: 2023-11-12 | Discharge: 2023-11-12 | Disposition: A | Discharge status: Home / Self Care | Source: Ambulatory Visit | Attending: Physician Assistant | Admitting: Physician Assistant

## 2023-11-12 DIAGNOSIS — N959 Unspecified menopausal and perimenopausal disorder: Secondary | ICD-10-CM | POA: Diagnosis not present

## 2023-11-12 DIAGNOSIS — Z1231 Encounter for screening mammogram for malignant neoplasm of breast: Secondary | ICD-10-CM

## 2023-11-30 ENCOUNTER — Other Ambulatory Visit: Payer: Self-pay | Admitting: Physician Assistant

## 2023-11-30 DIAGNOSIS — I10 Essential (primary) hypertension: Secondary | ICD-10-CM

## 2023-12-13 ENCOUNTER — Ambulatory Visit (INDEPENDENT_AMBULATORY_CARE_PROVIDER_SITE_OTHER)

## 2023-12-13 DIAGNOSIS — E538 Deficiency of other specified B group vitamins: Secondary | ICD-10-CM

## 2023-12-13 MED ORDER — CYANOCOBALAMIN 1000 MCG/ML IJ SOLN
1000.0000 ug | Freq: Once | INTRAMUSCULAR | Status: AC
  Administered 2023-12-13: 1000 ug via INTRAMUSCULAR

## 2023-12-13 NOTE — Progress Notes (Signed)
 Patient is in office today for a nurse visit for Immunization. Patient Injection was given in the  Left vastus lateralis. Patient tolerated injection well.

## 2024-01-13 ENCOUNTER — Ambulatory Visit (INDEPENDENT_AMBULATORY_CARE_PROVIDER_SITE_OTHER)

## 2024-01-13 DIAGNOSIS — E559 Vitamin D deficiency, unspecified: Secondary | ICD-10-CM

## 2024-01-13 MED ORDER — CYANOCOBALAMIN 1000 MCG/ML IJ SOLN
1000.0000 ug | Freq: Once | INTRAMUSCULAR | Status: AC
  Administered 2024-01-13: 1000 ug via INTRAMUSCULAR

## 2024-01-13 NOTE — Progress Notes (Signed)
 Patient is in office today for a nurse visit for B12 Injection. Patient Injection was given in the  Left upper quad. gluteus. Patient tolerated injection well.   Vitamin B12 injection  NDC: 29930-994-98 LOT: [id number] EXP: 10/01/2024

## 2024-02-14 ENCOUNTER — Ambulatory Visit (INDEPENDENT_AMBULATORY_CARE_PROVIDER_SITE_OTHER)

## 2024-02-14 DIAGNOSIS — E538 Deficiency of other specified B group vitamins: Secondary | ICD-10-CM

## 2024-02-14 MED ORDER — CYANOCOBALAMIN 1000 MCG/ML IJ SOLN
1000.0000 ug | Freq: Once | INTRAMUSCULAR | Status: AC
  Administered 2024-02-14: 1000 ug via INTRAMUSCULAR

## 2024-04-17 ENCOUNTER — Ambulatory Visit: Admitting: Physician Assistant

## 2024-10-26 ENCOUNTER — Ambulatory Visit
